# Patient Record
Sex: Male | Born: 1960 | Race: Black or African American | Hispanic: No | Marital: Single | Smoking: Current every day smoker
Health system: Southern US, Community
[De-identification: ages and names within clinical notes are randomized; demographics above are authoritative.]

## PROBLEM LIST (undated history)

## (undated) DIAGNOSIS — I2510 Atherosclerotic heart disease of native coronary artery without angina pectoris: Secondary | ICD-10-CM

## (undated) DIAGNOSIS — G629 Polyneuropathy, unspecified: Secondary | ICD-10-CM

## (undated) DIAGNOSIS — I10 Essential (primary) hypertension: Secondary | ICD-10-CM

## (undated) DIAGNOSIS — E785 Hyperlipidemia, unspecified: Secondary | ICD-10-CM

## (undated) DIAGNOSIS — M1990 Unspecified osteoarthritis, unspecified site: Secondary | ICD-10-CM

## (undated) DIAGNOSIS — G8929 Other chronic pain: Secondary | ICD-10-CM

## (undated) DIAGNOSIS — F32A Depression, unspecified: Secondary | ICD-10-CM

## (undated) DIAGNOSIS — J449 Chronic obstructive pulmonary disease, unspecified: Secondary | ICD-10-CM

## (undated) DIAGNOSIS — F329 Major depressive disorder, single episode, unspecified: Secondary | ICD-10-CM

## (undated) DIAGNOSIS — R569 Unspecified convulsions: Secondary | ICD-10-CM

## (undated) DIAGNOSIS — K219 Gastro-esophageal reflux disease without esophagitis: Secondary | ICD-10-CM

## (undated) DIAGNOSIS — F1910 Other psychoactive substance abuse, uncomplicated: Secondary | ICD-10-CM

## (undated) DIAGNOSIS — S3210XA Unspecified fracture of sacrum, initial encounter for closed fracture: Secondary | ICD-10-CM

## (undated) DIAGNOSIS — M549 Dorsalgia, unspecified: Secondary | ICD-10-CM

## (undated) DIAGNOSIS — K259 Gastric ulcer, unspecified as acute or chronic, without hemorrhage or perforation: Secondary | ICD-10-CM

## (undated) DIAGNOSIS — I219 Acute myocardial infarction, unspecified: Secondary | ICD-10-CM

## (undated) HISTORY — DX: Unspecified fracture of sacrum, initial encounter for closed fracture: S32.10XA

## (undated) HISTORY — DX: Major depressive disorder, single episode, unspecified: F32.9

## (undated) HISTORY — DX: Essential (primary) hypertension: I10

## (undated) HISTORY — PX: COLONOSCOPY W/ POLYPECTOMY: SHX1380

## (undated) HISTORY — DX: Hyperlipidemia, unspecified: E78.5

## (undated) HISTORY — PX: CORONARY ANGIOPLASTY WITH STENT PLACEMENT: SHX49

## (undated) HISTORY — DX: Polyneuropathy, unspecified: G62.9

## (undated) HISTORY — DX: Depression, unspecified: F32.A

## (undated) HISTORY — PX: FRACTURE SURGERY: SHX138

## (undated) HISTORY — PX: OTHER SURGICAL HISTORY: SHX169

## (undated) HISTORY — PX: HERNIA REPAIR: SHX51

## (undated) HISTORY — DX: Gastro-esophageal reflux disease without esophagitis: K21.9

---

## 1988-05-12 DIAGNOSIS — S3210XA Unspecified fracture of sacrum, initial encounter for closed fracture: Secondary | ICD-10-CM

## 1988-05-12 HISTORY — DX: Unspecified fracture of sacrum, initial encounter for closed fracture: S32.10XA

## 2009-10-21 ENCOUNTER — Emergency Department (HOSPITAL_COMMUNITY): Admission: EM | Admit: 2009-10-21 | Discharge: 2009-10-21 | Payer: Self-pay | Admitting: Emergency Medicine

## 2010-07-29 LAB — DIFFERENTIAL
Lymphocytes Relative: 39 % (ref 12–46)
Monocytes Absolute: 0.3 10*3/uL (ref 0.1–1.0)
Monocytes Relative: 7 % (ref 3–12)

## 2010-07-29 LAB — CBC
Hemoglobin: 13.6 g/dL (ref 13.0–17.0)
MCV: 86.2 fL (ref 78.0–100.0)
RBC: 4.56 MIL/uL (ref 4.22–5.81)
WBC: 5.1 10*3/uL (ref 4.0–10.5)

## 2010-10-12 ENCOUNTER — Emergency Department (HOSPITAL_COMMUNITY)
Admission: EM | Admit: 2010-10-12 | Discharge: 2010-10-12 | Disposition: A | Discharge status: Home / Self Care | Payer: Medicare Other | Attending: Emergency Medicine | Admitting: Emergency Medicine

## 2010-10-12 ENCOUNTER — Emergency Department (HOSPITAL_COMMUNITY): Payer: Medicare Other

## 2010-10-12 DIAGNOSIS — R51 Headache: Secondary | ICD-10-CM | POA: Insufficient documentation

## 2010-10-12 DIAGNOSIS — R0989 Other specified symptoms and signs involving the circulatory and respiratory systems: Secondary | ICD-10-CM | POA: Insufficient documentation

## 2010-10-12 DIAGNOSIS — R0609 Other forms of dyspnea: Secondary | ICD-10-CM | POA: Insufficient documentation

## 2010-10-12 DIAGNOSIS — R0602 Shortness of breath: Secondary | ICD-10-CM | POA: Insufficient documentation

## 2010-10-12 DIAGNOSIS — I252 Old myocardial infarction: Secondary | ICD-10-CM | POA: Insufficient documentation

## 2010-10-12 DIAGNOSIS — F329 Major depressive disorder, single episode, unspecified: Secondary | ICD-10-CM | POA: Insufficient documentation

## 2010-10-12 DIAGNOSIS — M129 Arthropathy, unspecified: Secondary | ICD-10-CM | POA: Insufficient documentation

## 2010-10-12 DIAGNOSIS — I10 Essential (primary) hypertension: Secondary | ICD-10-CM | POA: Insufficient documentation

## 2010-10-12 DIAGNOSIS — M62838 Other muscle spasm: Secondary | ICD-10-CM | POA: Insufficient documentation

## 2010-10-12 DIAGNOSIS — R42 Dizziness and giddiness: Secondary | ICD-10-CM | POA: Insufficient documentation

## 2010-10-12 DIAGNOSIS — F3289 Other specified depressive episodes: Secondary | ICD-10-CM | POA: Insufficient documentation

## 2010-10-12 LAB — URINALYSIS, ROUTINE W REFLEX MICROSCOPIC
Bilirubin Urine: NEGATIVE
Hgb urine dipstick: NEGATIVE
Nitrite: NEGATIVE
Specific Gravity, Urine: 1.009 (ref 1.005–1.030)
Urobilinogen, UA: 0.2 mg/dL (ref 0.0–1.0)

## 2010-10-12 LAB — POCT I-STAT, CHEM 8
Chloride: 103 mEq/L (ref 96–112)
Glucose, Bld: 86 mg/dL (ref 70–99)
HCT: 45 % (ref 39.0–52.0)
Hemoglobin: 15.3 g/dL (ref 13.0–17.0)
TCO2: 26 mmol/L (ref 0–100)

## 2010-10-12 LAB — TROPONIN I: Troponin I: <0.3 ng/mL (ref <0.30)

## 2010-10-14 LAB — URINE CULTURE: Culture  Setup Time: 201206022018

## 2010-12-25 ENCOUNTER — Encounter: Payer: Self-pay | Admitting: Internal Medicine

## 2010-12-25 ENCOUNTER — Ambulatory Visit (INDEPENDENT_AMBULATORY_CARE_PROVIDER_SITE_OTHER): Payer: Medicare Other | Admitting: Internal Medicine

## 2010-12-25 DIAGNOSIS — I10 Essential (primary) hypertension: Secondary | ICD-10-CM

## 2010-12-25 DIAGNOSIS — K219 Gastro-esophageal reflux disease without esophagitis: Secondary | ICD-10-CM

## 2010-12-25 DIAGNOSIS — R51 Headache: Secondary | ICD-10-CM

## 2010-12-25 DIAGNOSIS — F329 Major depressive disorder, single episode, unspecified: Secondary | ICD-10-CM

## 2010-12-25 DIAGNOSIS — M549 Dorsalgia, unspecified: Secondary | ICD-10-CM | POA: Insufficient documentation

## 2010-12-25 MED ORDER — HYDROCODONE-ACETAMINOPHEN 5-500 MG PO TABS: 1.0000 | ORAL_TABLET | Freq: Every day | ORAL | Status: DC

## 2010-12-25 MED ORDER — OMEPRAZOLE 20 MG PO CPDR: 20.0000 mg | DELAYED_RELEASE_CAPSULE | Freq: Every day | ORAL | Status: DC

## 2010-12-25 MED ORDER — ASPIRIN 81 MG PO TABS: 81.0000 mg | ORAL_TABLET | Freq: Every day | ORAL | Status: DC

## 2010-12-25 MED ORDER — FLUOXETINE HCL 10 MG PO CAPS: 10.0000 mg | ORAL_CAPSULE | Freq: Every day | ORAL | Status: DC

## 2010-12-25 MED ORDER — TERAZOSIN HCL 1 MG PO CAPS: 1.0000 mg | ORAL_CAPSULE | Freq: Every day | ORAL | Status: DC

## 2010-12-25 MED ORDER — LISINOPRIL 5 MG PO TABS: 5.0000 mg | ORAL_TABLET | Freq: Every day | ORAL | Status: DC

## 2010-12-25 NOTE — Patient Instructions (Signed)
Please schedule a follow up appointment in 1 week.
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
## 2010-12-26 DIAGNOSIS — F329 Major depressive disorder, single episode, unspecified: Secondary | ICD-10-CM | POA: Insufficient documentation

## 2010-12-26 NOTE — Assessment & Plan Note (Signed)
His medication list from [facility] was retrieved which shows that the patient is on Prozac 10. Denies any suicidal thoughts or ideations. Will continue current regimen for now.

## 2010-12-26 NOTE — Assessment & Plan Note (Addendum)
He complains of chronic back pain radiating to both legs. Etiology for his back pain is unclear at this point differentials include degenerative disc disease versus spinal stenosis. Will try to obtain records from [facility]. Will  give him 10 pills of Vicodin for symptom relief. I would review the records thoroughly before we start prescribing him narcotics or making him sign even pain contract. Patient has several red flags- sharing Percocets from his father, DWI ,history of cocaine abuse in the past!

## 2010-12-26 NOTE — Assessment & Plan Note (Signed)
Blood pressure is stable. [facility] medication [facility] from [facility] was retrieved. Patient is on 5 mg of lisinopril. We'll continue him on that

## 2010-12-27 DIAGNOSIS — R519 Headache, unspecified: Secondary | ICD-10-CM | POA: Insufficient documentation

## 2010-12-27 DIAGNOSIS — K219 Gastro-esophageal reflux disease without esophagitis: Secondary | ICD-10-CM | POA: Insufficient documentation

## 2010-12-27 DIAGNOSIS — R51 Headache: Secondary | ICD-10-CM | POA: Insufficient documentation

## 2010-12-27 NOTE — Assessment & Plan Note (Signed)
He reports headache located in the occipital region that has worsened and on for past few months. Differentials include stress versus hypertension. We'll continue to monitor for now. Patient was advised to followup in one week so that we are able to obtain his records and have more information about his chronic conditions.

## 2011-01-02 ENCOUNTER — Encounter: Payer: Medicare Other | Admitting: Internal Medicine

## 2011-01-06 ENCOUNTER — Emergency Department (HOSPITAL_COMMUNITY)
Admission: EM | Admit: 2011-01-06 | Discharge: 2011-01-06 | Disposition: A | Discharge status: Home / Self Care | Payer: Medicare Other | Attending: Emergency Medicine | Admitting: Emergency Medicine

## 2011-01-06 ENCOUNTER — Encounter: Payer: Medicare Other | Admitting: Internal Medicine

## 2011-01-06 DIAGNOSIS — M129 Arthropathy, unspecified: Secondary | ICD-10-CM | POA: Insufficient documentation

## 2011-01-06 DIAGNOSIS — H11429 Conjunctival edema, unspecified eye: Secondary | ICD-10-CM | POA: Insufficient documentation

## 2011-01-06 DIAGNOSIS — H538 Other visual disturbances: Secondary | ICD-10-CM | POA: Insufficient documentation

## 2011-01-06 DIAGNOSIS — T1590XA Foreign body on external eye, part unspecified, unspecified eye, initial encounter: Secondary | ICD-10-CM | POA: Insufficient documentation

## 2011-01-06 DIAGNOSIS — Z79899 Other long term (current) drug therapy: Secondary | ICD-10-CM | POA: Insufficient documentation

## 2011-01-06 DIAGNOSIS — S0510XA Contusion of eyeball and orbital tissues, unspecified eye, initial encounter: Secondary | ICD-10-CM | POA: Insufficient documentation

## 2011-01-06 DIAGNOSIS — I10 Essential (primary) hypertension: Secondary | ICD-10-CM | POA: Insufficient documentation

## 2011-01-06 DIAGNOSIS — I252 Old myocardial infarction: Secondary | ICD-10-CM | POA: Insufficient documentation

## 2011-01-06 DIAGNOSIS — F329 Major depressive disorder, single episode, unspecified: Secondary | ICD-10-CM | POA: Insufficient documentation

## 2011-01-06 DIAGNOSIS — H11419 Vascular abnormalities of conjunctiva, unspecified eye: Secondary | ICD-10-CM | POA: Insufficient documentation

## 2011-01-06 DIAGNOSIS — F3289 Other specified depressive episodes: Secondary | ICD-10-CM | POA: Insufficient documentation

## 2011-01-06 DIAGNOSIS — H5710 Ocular pain, unspecified eye: Secondary | ICD-10-CM | POA: Insufficient documentation

## 2011-01-06 DIAGNOSIS — H5789 Other specified disorders of eye and adnexa: Secondary | ICD-10-CM | POA: Insufficient documentation

## 2011-01-14 ENCOUNTER — Encounter: Payer: Self-pay | Admitting: Internal Medicine

## 2011-01-14 ENCOUNTER — Ambulatory Visit (INDEPENDENT_AMBULATORY_CARE_PROVIDER_SITE_OTHER): Payer: Medicare Other | Admitting: Internal Medicine

## 2011-01-14 DIAGNOSIS — I10 Essential (primary) hypertension: Secondary | ICD-10-CM

## 2011-01-14 DIAGNOSIS — I739 Peripheral vascular disease, unspecified: Secondary | ICD-10-CM

## 2011-01-14 DIAGNOSIS — F329 Major depressive disorder, single episode, unspecified: Secondary | ICD-10-CM

## 2011-01-14 DIAGNOSIS — M549 Dorsalgia, unspecified: Secondary | ICD-10-CM

## 2011-01-14 DIAGNOSIS — R51 Headache: Secondary | ICD-10-CM

## 2011-01-14 MED ORDER — FLUOXETINE HCL 10 MG PO CAPS: 10.0000 mg | ORAL_CAPSULE | Freq: Every day | ORAL | Status: DC

## 2011-01-14 MED ORDER — CYCLOBENZAPRINE HCL 10 MG PO TABS: 10.0000 mg | ORAL_TABLET | Freq: Three times a day (TID) | ORAL | Status: DC | PRN
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
## 2011-01-14 NOTE — Patient Instructions (Signed)
Back Pain - Chronic Back pain seldom lasts longer than 3 months. Long standing back pain can be caused by a ruptured disc. About half the time the exact cause cannot be found. Arthritis, osteoporosis, tumors, infections, previous back surgery, and other causes may be involved. Anxiety and depression are common factors. A ruptured disc often causes sciatica. This is a pain traveling from the low back down the back of the leg. This is due to irritation of the sciatic nerve. Weakness or numbness in the legs or feet and loss of normal bladder control are more serious signs of disc rupture. You should contact your doctor immediately if these symptoms develop. Avoid bending, heavy lifting, prolonged sitting, and activities which make the problem worse. Continue normal activity as much as possible. Take brief periods of rest throughout the day to reduce your pain during bad periods. A back exercise rehabilitation program can be helpful in reducing symptoms and preventing more pain. Muscle relaxants are sometimes used. Using narcotic pain medicine for long term pain is discouraged. Addiction is a possible outcome.  Surgery is considered only when symptoms do not improve with bed rest and other conservative treatment. You should see your caregiver if problems get worse. SEEK IMMEDIATE MEDICAL CARE IF:  You have marked weakness or numbness in one of your legs.   You have trouble controlling your bladder or bowels.   You develop nausea, vomiting, abdominal pain, shortness of breath or fainting.   You have severe pain not relieved with medications.  Document Released: 06/05/2004 Document Re-Released: 07/25/2008 Exit[employer] Patient Information 2011 [place], [place].  1. Start Flexeril 10 mg three times daily for your back pain.  You can continue to use heat pads as needed for the pain as well.  2. We will make arrangements for the check of your blood vessels in your legs.  3. Follow up in 2 weeks to see how your  back is doing.

## 2011-01-15 NOTE — Assessment & Plan Note (Signed)
BP Readings from Last 3 Encounters:  01/14/11 132/79  12/25/10 131/88   Blood pressure is similar to his last reading.  He has been taking the lisinopril 5 mg and states that he feels better.  We will continue to monitor.  With his history of MI his goal for his blood pressure is >130/80.

## 2011-01-15 NOTE — Assessment & Plan Note (Signed)
His symptoms of "tightness" behind his knees that worsens with activity is concerning given his current smoking as well as his history of MI even in the setting of cocaine abuse.  I am concerned for possible PVD and claudication that could be causing his leg pain.  We will get ABIs and follow up to see if there is anything that may be reversible or an explanation for his leg pain that does not seem to come from spinal cord compression.

## 2011-01-15 NOTE — Assessment & Plan Note (Signed)
I reviewed his MRI report from [facility] and he has mild/moderate L4-L5 disk disease but there is no definite compression of the nerves on MRI.  His symptoms do not fit neuropathic pain from his disk and his previous need for 4-8 percocets seems way out of proportion to what is seem on the MRI.  He has several warning signs for opiate abuse and would be a high risk candidate for opiate therapy.  His current symptoms appear to be from musculoskeletal back pain and would be better served with physical therapy, ice, heat packs, and occasional OTC pain medication.  With his history of GERD NSAIDs are likely not the best choice but could be tried on a very short term basis with a PPI for gastric protection.    OPIOID RISK TOOL - MALE  Family history of : # points   Alcohol abuse 3 Yes  Illegal drug use 3 Yes  Prescription drug abuse 4 No      Personal History of :    Alcohol abuse 3 Yes  Illegal drug use 4 Yes  Prescription drug abuse 5 No      Age 50 - 43 yrs 1 No      Psychological disease    ADHD / OCD / Bipolar / Schophrenia 2 No  Depression 1 Yes   Total Score 14  Low risk  0-3 Moderate risk  4-7 High risk  >7

## 2011-01-15 NOTE — Assessment & Plan Note (Signed)
His headaches are much improved with control of his blood pressure.  He states that he has some occasional headaches but not nearly as often and severe since his restarted his blood pressure medication.

## 2011-01-15 NOTE — Assessment & Plan Note (Signed)
He states that his mood has been good since he has moved back to [place] but states that he wants to restart his prozac anyway.  We will prescribe it for now and try to get him off in 6-12 months to see how he does.

## 2011-01-28 ENCOUNTER — Ambulatory Visit (INDEPENDENT_AMBULATORY_CARE_PROVIDER_SITE_OTHER): Payer: Medicare Other | Admitting: Internal Medicine

## 2011-01-28 ENCOUNTER — Encounter: Payer: Self-pay | Admitting: Internal Medicine

## 2011-01-28 VITALS — BP 149/91 | HR 88 | Temp 96.5°F | Ht 66.0 in | Wt 190.3 lb

## 2011-01-28 DIAGNOSIS — K219 Gastro-esophageal reflux disease without esophagitis: Secondary | ICD-10-CM

## 2011-01-28 DIAGNOSIS — I10 Essential (primary) hypertension: Secondary | ICD-10-CM

## 2011-01-28 DIAGNOSIS — M549 Dorsalgia, unspecified: Secondary | ICD-10-CM

## 2011-01-28 DIAGNOSIS — Z0000 Encounter for general adult medical examination without abnormal findings: Secondary | ICD-10-CM

## 2011-01-28 MED ORDER — LISINOPRIL 5 MG PO TABS: 10.0000 mg | ORAL_TABLET | Freq: Every day | ORAL | Status: DC

## 2011-01-28 MED ORDER — MELOXICAM 7.5 MG PO TABS: 7.5000 mg | ORAL_TABLET | Freq: Every day | ORAL | Status: AC

## 2011-01-28 MED ORDER — GABAPENTIN 800 MG PO TABS: 800.0000 mg | ORAL_TABLET | Freq: Three times a day (TID) | ORAL | Status: DC

## 2011-01-28 NOTE — Patient Instructions (Addendum)
  Please follow up in the pain clinic regarding additional therapies for you back pain. We have also referred you for a screening colonoscopy.  They will call you with the appointment.    Return to clinic to see your PCP in 1 month.  Please bring all your medications to your next clinic appointment.

## 2011-01-28 NOTE — Assessment & Plan Note (Signed)
Blood pressure is above goal of 130/70. While this could be elevated secondary to pain, he states his blood pressure always runs as high. Given this, I will increase his antihypertensive regimen today.  -- Increase lisinopril to 10 mg daily.

## 2011-01-28 NOTE — Assessment & Plan Note (Signed)
Flu shot refused. Referral for colonoscopy placed.

## 2011-01-28 NOTE — Assessment & Plan Note (Addendum)
I agree with Dr. Donnelly Stager assessment that long-term opiate therapy is very risky in this patient. I offered him restarting gabapentin, as he states this improved his pain and he described the radiations as having an electric component.  I offered him meloxicam, as it has the least GI toxicity of all the NSAIDs. We also discussed tramadol therapy. He states that he will not take either of these medications. He states the he doesn't know how how meloxicam might affect his system and doesn't want to try anything different. First he denied ever taking tramadol, but then refused it saying it made him "twitchy" when I suggested that as part of the plan. I also referred him for physical therapy and pain medicine consult. Mr. Benzel said he would likely would not attend pain medicine consult. He said that he would not trust anyone to perform injection into his back. I encouraged him to attend their clinic, as they might be able to offer him adjuvant therapies that I am unfamiliar with.   As Mr. Spradley is very resistant to non-opiate therapy for his back pain, or even seeing a pain medicine specialist for further pain management, I do not think the potential risk of prescribing opiates are warranted at this time. Further, reported MRI results does not inspire confidence that opiates are indicated.  Mr. Liford became very aggravated when he was told he would not be receiving Percocet at today's visit and indicated that he would continue to receive Percocet from his father.   -- Restart gabapentin 800 mg with taper up to 3 times a day. -- Sent in a prescription for meloxicam 7.5 mg daily. I told the patient that he could fill this prescription or not but that I wanted him to have something available for further pain issues. -- I elected not to issue a prescription for tramadol given lack of patient buy-in. -- pain medicine referral -- PT referral

## 2011-01-28 NOTE — Progress Notes (Signed)
Subjective:   Patient ID: Adam Cameron male   DOB: 04/20/1961 50 y.o.   MRN: [id number]  HPI: Adam Cameron is a 50 y.o. man with past medical history significant for back pain, hypertension, GERD and depression. He presents for followup regarding his back pain.  2 weeks ago Adam Cameron was seen by Dr. Tonny Branch regarding his back pain. Dr. Tonny Branch recommended ice packs alternated with heat packs, PT, controlling GERD with PPI, and then gently initiating nonsteroidal anti-inflammatory therapy. Dr. Tonny Branch also reviewed MRI report for spinal MRI done at [facility] which showed mild to moderate L4-L5 disc disease. Adam Cameron states that his pain has been severe over the past 2 weeks since his Percocet and gabapentin have been discontinued. He is requesting Percocet 10-650 4 times a day and Neurontin 800 3 times a day.  He states that he is taking his father's Percocet. He is willing to undergo pain contract in our clinic if we are willing to give him his "perks."    Briefly, Adam Cameron states that his back pain began when he fell off a roof and injured his tailbone. The pain is located in his low back extending bilaterally at the level of the iliac crests. He states the pain is constant and throbbing with electric sensations down his posterior thighs. He denies any loss of bowel or bladder control. Denies saddle anesthesia. He has not been taking any over-the-counter pain control. He states Tylenol does not work. He states that he not take nonsteroidals given his stomach problems. At baseline, his pain is 4-10 peaking at 9/10 occasionally.   Review of systems: Denies fevers, chills, headache, dizziness, reflux, chest pain, shortness of breath, abdominal pain, melena or bright red blood per rectum.  Past Medical History  Diagnosis Date  . GERD (gastroesophageal reflux disease)   . Hypertension   . Depression   . Neuropathy    Current Outpatient Prescriptions  Medication Sig Dispense  Refill  . aspirin 81 MG tablet Take 1 tablet (81 mg total) by mouth daily.  30 tablet  3  . cyclobenzaprine (FLEXERIL) 10 MG tablet Take 1 tablet (10 mg total) by mouth 3 (three) times daily as needed.  30 tablet  3  . FLUoxetine (PROZAC) 10 MG capsule Take 1 capsule (10 mg total) by mouth daily.  30 capsule  0  . lisinopril (PRINIVIL,ZESTRIL) 5 MG tablet Take 1 tablet (5 mg total) by mouth daily.  30 tablet  3  . omeprazole (PRILOSEC) 20 MG capsule Take 1 capsule (20 mg total) by mouth daily.  30 capsule  1   Family History  Problem Relation Age of Onset  . Diabetes Mother   . Hypertension Mother   . Hypertension Father   . Stroke Father   . Heart disease Father    History   Social History  . Marital Status: Single    Spouse Name: N/A    Number of Children: N/A  . Years of Education: N/A   Social History Main Topics  . Smoking status: Current Everyday Smoker -- 0.5 packs/day    Types: Cigarettes  . Smokeless tobacco: Not on file   Comment: has started to on his own.  Recently restarted. 01/14/2011 trying to stop again  . Alcohol Use: Yes     occasional wine or beer  . Drug Use: No     History of cocaine abuse in 2006.  Marland Kitchen Sexually Active: Not on file   Other Topics Concern  . Not on file  Social History Narrative   Single. Live by himself in [place].    Objective:  Physical Exam: There were no vitals filed for this visit. Constitutional: Vital signs reviewed.  Patient is a well-developed and well-nourished man in no acute distress and cooperative with exam. Alert and oriented x3.  Head: Normocephalic and atraumatic Mouth: no erythema or exudates, MMM. Patient had a teeth Eyes: PERRL, EOMI, conjunctivae normal, No scleral icterus.  Neck: Supple, Trachea midline normal ROM, Cardiovascular: RRR, S1 normal, S2 normal, no MRG, pulses symmetric and intact bilaterally Pulmonary/Chest: CTAB, no wheezes, rales, or rhonchi Abdominal: Soft. Non-tender, non-distended, bowel  sounds are normal, no masses, organomegaly, or guarding present.  Musculoskeletal: No joint deformities, erythema. The patient is distracted he is able to perform sit up without flinching. Patient has pain upon palpation just superior to the sacrum on the spine as well as the bilateral erector spinae muscles lateral to this level. Straight leg test is negative for spinal stenosis.  Neurological: A&O x3, Strenght is normal and symmetric bilaterally, cranial nerve II-XII are grossly intact, no focal motor deficit, sensory intact to light touch bilaterally.  Skin: Warm, dry and intact. No rash, cyanosis, or clubbing.

## 2011-01-29 NOTE — Progress Notes (Signed)
Dr Samuella Bruin discussed Mr [facility] care with me and I agree with his note and his assessment that the pt is at high risk for opioid misuse. It is also concerning that Mr Standley refused any nonnarcotic treatment options that Dr Candy Sledge offered.

## 2011-02-18 ENCOUNTER — Encounter: Payer: Medicare Other | Attending: Physical Medicine & Rehabilitation

## 2011-02-18 ENCOUNTER — Ambulatory Visit: Payer: Medicare Other | Admitting: Physical Medicine & Rehabilitation

## 2011-02-23 ENCOUNTER — Encounter: Payer: Self-pay | Admitting: Internal Medicine

## 2011-02-23 DIAGNOSIS — Z0000 Encounter for general adult medical examination without abnormal findings: Secondary | ICD-10-CM | POA: Insufficient documentation

## 2011-02-24 ENCOUNTER — Ambulatory Visit (INDEPENDENT_AMBULATORY_CARE_PROVIDER_SITE_OTHER): Payer: Medicare Other | Admitting: Internal Medicine

## 2011-02-24 VITALS — BP 131/84 | HR 81 | Temp 97.1°F | Ht 66.0 in | Wt 194.0 lb

## 2011-02-24 DIAGNOSIS — M549 Dorsalgia, unspecified: Secondary | ICD-10-CM

## 2011-02-24 NOTE — Patient Instructions (Signed)
Please take your medicines as prescribed. Please schedule a follow up appointment in 2 months. Please bring your medication bottles with your next visit.  Back Pain (Lumbosacral Strain)  Back pain is one of the most common causes of pain. There are many causes of back pain. Most are not serious conditions.  CAUSES Your backbone (spinal column) is made up of 24 main vertebral bodies, the sacrum, and the coccyx. These are held together by muscles and tough, fibrous tissue (ligaments). Nerve roots pass through the openings between the vertebrae. A sudden move or injury to the back may cause injury to, or pressure on, these nerves. This may result in localized back pain or pain movement (radiation) into the buttocks, down the leg, and into the foot. Sharp, shooting pain from the buttock down the back of the leg (sciatica) is frequently associated with a ruptured (herniated) disc. Pain may be caused by muscle spasm alone. Your caregiver can often find the cause of your pain by the details of your symptoms and an exam. In some cases, you may need tests (such as X-rays). Your caregiver will work with you to decide if any tests are needed based on your specific exam. HOME CARE INSTRUCTIONS  Avoid an underactive lifestyle. Active exercise, as directed by your caregiver, is your greatest weapon against back pain.   Avoid hard physical activities (tennis, racquetball, water-skiing) if you are not in proper physical condition for it. This may aggravate and/or create problems.   If you have a back problem, avoid sports requiring sudden body movements. Swimming and walking are generally safer activities.   Maintain good posture.   Avoid becoming overweight (obese).   Use bed rest for only the most extreme, sudden (acute) episode. Your caregiver will help you determine how much bed rest is necessary.   For acute conditions, you may put ice on the injured area.   Put ice in a plastic bag.   Place a  towel between your skin and the bag.   Leave the ice on for 5 minutes at a time, every 2 hours, or as needed.   After you are improved and more active, it may help to apply heat for 30 minutes before activities.  See your caregiver if you are having pain that lasts longer than expected. Your caregiver can advise appropriate exercises and/or therapy if needed. With conditioning, most back problems can be avoided. SEEK IMMEDIATE MEDICAL CARE IF:  You have numbness, tingling, weakness, or problems with the use of your arms or legs.   You experience severe back pain not relieved with medicines.   There is a change in bowel or bladder control.   You have increasing pain in any area of the body, including your belly (abdomen).   You notice shortness of breath, dizziness, or feel faint.   You feel sick to your stomach (nauseous), are throwing up (vomiting), or become sweaty.   You notice discoloration of your toes or legs, or your feet get very cold. MAKE SURE YOU:   Understand these instructions.   Will watch your condition.   Will get help right away if you are not doing well or get worse.  Document Released: 02/05/2005 Document Re-Released: 07/23/2009 [facility] Patient Information 2011 [place], [place].

## 2011-02-24 NOTE — Assessment & Plan Note (Addendum)
He continues to complain of chronic low back pain and was requesting Percocets at this visit. Just to re- emphasize, his MRI was reviewed that showed L4-5 disc disease but not significant enough to start him on pain medications. Apparently he went to the pain medicine clinic where he was advised to get steroid shots that he refused. He was again offered  tramadol that he refused.  -Would not prescribe him opiates. He was very frustrated on the fact that we could not give him any opiates and therefore did not want to discuss any other medical problems with today's visit.

## 2011-02-24 NOTE — Progress Notes (Signed)
  Subjective:    Patient ID: Adam Cameron, male    DOB: [birth year], 50 y.o.   MRN: [id number]  HPI: 50 year old man with past medical history significant for hypertension, chronic low back pain, depression comes to the clinic for a followup visit.  He had no concerns at today's visit except for his low back pain. He  is very frustrated with our care and on the fact that we are not prescribing him opiate pain medications.  Apparently he went to the pain medicine clinic and they prescribed him some shots in his back which he didn't want to take. He never followed up with PT/OT.  He states that sharing his father's Percocets but for the last 4 days, he has not been taking any pain medications because his father is in the hospital.  He currently rates his pain as 10 out of 10 , describes pain and burning and throbbing, located in the lower back with radiation into her buttocks and thighs.    Review of Systems  Constitutional: Negative for fever, diaphoresis, activity change and appetite change.  HENT: Negative for ear pain, nosebleeds, facial swelling, sneezing, neck pain and postnasal drip.   Eyes: Negative for visual disturbance.  Respiratory: Negative for apnea, cough, choking, chest tightness and shortness of breath.   Cardiovascular: Negative for chest pain, palpitations and leg swelling.  Gastrointestinal: Negative for abdominal pain, abdominal distention and anal bleeding.  Genitourinary: Negative for dysuria, hematuria, flank pain, enuresis and difficulty urinating.  Musculoskeletal: Positive for back pain. Negative for arthralgias.  Neurological: Negative for dizziness, seizures, light-headedness, numbness and headaches.  Hematological: Negative for adenopathy.  Psychiatric/Behavioral: Negative for agitation.       Objective:   Physical Exam  Constitutional: He is oriented to person, place, and time. He appears well-developed and well-nourished. No distress.  HENT:  Head:  Normocephalic and atraumatic.  Mouth/Throat: No oropharyngeal exudate.  Eyes: Conjunctivae and EOM are normal. Pupils are equal, round, and reactive to light. Right eye exhibits no discharge. Left eye exhibits no discharge. No scleral icterus.  Neck: Normal range of motion. Neck supple. No JVD present. No tracheal deviation present. No thyromegaly present.  Cardiovascular: Normal rate, regular rhythm, normal heart sounds and intact distal pulses.  Exam reveals no gallop and no friction rub.   No murmur heard. Pulmonary/Chest: Effort normal and breath sounds normal. No stridor. No respiratory distress. He has no wheezes. He has no rales. He exhibits no tenderness.  Abdominal: Soft. Bowel sounds are normal. He exhibits no distension and no mass. There is no tenderness. There is no rebound and no guarding.  Musculoskeletal: Normal range of motion. He exhibits no edema and no tenderness.       Negative bilateral SLRT.  Lymphadenopathy:    He has no cervical adenopathy.  Neurological: He is alert and oriented to person, place, and time. He has normal reflexes. He displays normal reflexes. No cranial nerve deficit. He exhibits normal muscle tone. Coordination normal.  Skin: Skin is warm. He is not diaphoretic.          Assessment & Plan:

## 2011-02-27 ENCOUNTER — Ambulatory Visit: Payer: Medicare Other | Admitting: Internal Medicine

## 2011-02-27 DIAGNOSIS — Z0289 Encounter for other administrative examinations: Secondary | ICD-10-CM

## 2011-03-11 ENCOUNTER — Ambulatory Visit: Payer: Medicare Other | Admitting: Physical Medicine & Rehabilitation

## 2011-03-20 NOTE — Progress Notes (Signed)
Addended by: Neomia Dear on: 03/20/2011 06:35 PM   Modules accepted: Orders

## 2012-05-25 ENCOUNTER — Encounter: Payer: Self-pay | Admitting: Gastroenterology

## 2012-06-16 ENCOUNTER — Telehealth: Payer: Self-pay

## 2012-06-21 ENCOUNTER — Encounter: Payer: Medicare Other | Admitting: Gastroenterology

## 2012-06-22 NOTE — Telephone Encounter (Signed)
Left message

## 2012-07-13 ENCOUNTER — Ambulatory Visit: Payer: Medicare Other | Admitting: Endocrinology

## 2012-07-16 ENCOUNTER — Ambulatory Visit: Payer: Medicare Other | Admitting: Endocrinology

## 2012-07-20 ENCOUNTER — Ambulatory Visit: Payer: Medicare Other | Admitting: Endocrinology

## 2012-07-26 ENCOUNTER — Ambulatory Visit (INDEPENDENT_AMBULATORY_CARE_PROVIDER_SITE_OTHER): Payer: Medicare Other | Admitting: Endocrinology

## 2012-07-26 VITALS — BP 142/78 | HR 83 | Wt 170.0 lb

## 2012-07-26 DIAGNOSIS — Z0000 Encounter for general adult medical examination without abnormal findings: Secondary | ICD-10-CM

## 2012-07-26 NOTE — Patient Instructions (Addendum)
please call [phone] ([facility] physician referral line), to get an appointment with a new primary doctor.

## 2012-07-26 NOTE — Progress Notes (Signed)
  Subjective:    Patient ID: Adam Cameron, male    DOB: 1961/01/21, 52 y.o.   MRN: [id number]  HPI No visit.  scheduling error   Review of Systems     Objective:   Physical Exam        Assessment & Plan:

## 2012-09-21 ENCOUNTER — Emergency Department (HOSPITAL_COMMUNITY)
Admission: EM | Admit: 2012-09-21 | Discharge: 2012-09-21 | Disposition: A | Discharge status: Home / Self Care | Payer: Medicare Other | Attending: Emergency Medicine | Admitting: Emergency Medicine

## 2012-09-21 ENCOUNTER — Emergency Department (HOSPITAL_COMMUNITY): Payer: Medicare Other

## 2012-09-21 ENCOUNTER — Encounter (HOSPITAL_COMMUNITY): Payer: Self-pay | Admitting: Emergency Medicine

## 2012-09-21 DIAGNOSIS — S0181XA Laceration without foreign body of other part of head, initial encounter: Secondary | ICD-10-CM

## 2012-09-21 DIAGNOSIS — IMO0002 Reserved for concepts with insufficient information to code with codable children: Secondary | ICD-10-CM | POA: Insufficient documentation

## 2012-09-21 DIAGNOSIS — Z8659 Personal history of other mental and behavioral disorders: Secondary | ICD-10-CM | POA: Insufficient documentation

## 2012-09-21 DIAGNOSIS — Z880 Allergy status to penicillin: Secondary | ICD-10-CM | POA: Insufficient documentation

## 2012-09-21 DIAGNOSIS — Z8719 Personal history of other diseases of the digestive system: Secondary | ICD-10-CM | POA: Insufficient documentation

## 2012-09-21 DIAGNOSIS — S62309A Unspecified fracture of unspecified metacarpal bone, initial encounter for closed fracture: Secondary | ICD-10-CM | POA: Insufficient documentation

## 2012-09-21 DIAGNOSIS — S0180XA Unspecified open wound of other part of head, initial encounter: Secondary | ICD-10-CM | POA: Insufficient documentation

## 2012-09-21 DIAGNOSIS — Z8669 Personal history of other diseases of the nervous system and sense organs: Secondary | ICD-10-CM | POA: Insufficient documentation

## 2012-09-21 DIAGNOSIS — I10 Essential (primary) hypertension: Secondary | ICD-10-CM | POA: Insufficient documentation

## 2012-09-21 DIAGNOSIS — S20319A Abrasion of unspecified front wall of thorax, initial encounter: Secondary | ICD-10-CM

## 2012-09-21 DIAGNOSIS — Z8781 Personal history of (healed) traumatic fracture: Secondary | ICD-10-CM | POA: Insufficient documentation

## 2012-09-21 DIAGNOSIS — S62304A Unspecified fracture of fourth metacarpal bone, right hand, initial encounter for closed fracture: Secondary | ICD-10-CM

## 2012-09-21 DIAGNOSIS — Z87891 Personal history of nicotine dependence: Secondary | ICD-10-CM | POA: Insufficient documentation

## 2012-09-21 MED ORDER — HYDROCODONE-ACETAMINOPHEN 5-325 MG PO TABS: 2.0000 | ORAL_TABLET | ORAL | Status: DC | PRN
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
## 2012-09-22 ENCOUNTER — Emergency Department (HOSPITAL_COMMUNITY)
Admission: EM | Admit: 2012-09-22 | Discharge: 2012-09-22 | Discharge status: Against Medical Advice | Payer: Medicare Other | Attending: Emergency Medicine | Admitting: Emergency Medicine

## 2012-09-22 ENCOUNTER — Telehealth (HOSPITAL_COMMUNITY): Payer: Self-pay | Admitting: Emergency Medicine

## 2012-09-22 ENCOUNTER — Encounter (HOSPITAL_COMMUNITY): Payer: Self-pay | Admitting: Emergency Medicine

## 2012-09-22 DIAGNOSIS — I10 Essential (primary) hypertension: Secondary | ICD-10-CM | POA: Insufficient documentation

## 2012-09-22 DIAGNOSIS — Z8719 Personal history of other diseases of the digestive system: Secondary | ICD-10-CM | POA: Insufficient documentation

## 2012-09-22 DIAGNOSIS — F3289 Other specified depressive episodes: Secondary | ICD-10-CM | POA: Insufficient documentation

## 2012-09-22 DIAGNOSIS — Z8669 Personal history of other diseases of the nervous system and sense organs: Secondary | ICD-10-CM | POA: Insufficient documentation

## 2012-09-22 DIAGNOSIS — F329 Major depressive disorder, single episode, unspecified: Secondary | ICD-10-CM | POA: Insufficient documentation

## 2012-09-22 DIAGNOSIS — Z87891 Personal history of nicotine dependence: Secondary | ICD-10-CM | POA: Insufficient documentation

## 2012-09-22 DIAGNOSIS — G8911 Acute pain due to trauma: Secondary | ICD-10-CM | POA: Insufficient documentation

## 2012-09-22 DIAGNOSIS — M79609 Pain in unspecified limb: Secondary | ICD-10-CM | POA: Insufficient documentation

## 2012-09-22 DIAGNOSIS — Z760 Encounter for issue of repeat prescription: Secondary | ICD-10-CM | POA: Insufficient documentation

## 2012-09-22 MED ORDER — TRAMADOL HCL 50 MG PO TABS: 50.0000 mg | ORAL_TABLET | Freq: Four times a day (QID) | ORAL | Status: DC | PRN
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
## 2012-09-22 NOTE — ED Provider Notes (Signed)
Medical screening examination/treatment/procedure(s) were performed by non-physician practitioner and as supervising physician I was immediately available for consultation/collaboration.   Carleene Cooper III, MD 09/22/12 Mikle Bosworth

## 2012-09-22 NOTE — ED Notes (Signed)
Per PA, patient was unhappy with medication that PA was going to prescribe and he decided to leave.

## 2012-12-13 ENCOUNTER — Emergency Department (HOSPITAL_COMMUNITY)
Admission: EM | Admit: 2012-12-13 | Discharge: 2012-12-13 | Disposition: A | Discharge status: Home / Self Care | Payer: No Typology Code available for payment source | Attending: Emergency Medicine | Admitting: Emergency Medicine

## 2012-12-13 ENCOUNTER — Encounter (HOSPITAL_COMMUNITY): Payer: Self-pay | Admitting: Emergency Medicine

## 2012-12-13 ENCOUNTER — Emergency Department (HOSPITAL_COMMUNITY): Payer: No Typology Code available for payment source

## 2012-12-13 DIAGNOSIS — F3289 Other specified depressive episodes: Secondary | ICD-10-CM | POA: Insufficient documentation

## 2012-12-13 DIAGNOSIS — Z8719 Personal history of other diseases of the digestive system: Secondary | ICD-10-CM | POA: Diagnosis not present

## 2012-12-13 DIAGNOSIS — F17200 Nicotine dependence, unspecified, uncomplicated: Secondary | ICD-10-CM | POA: Insufficient documentation

## 2012-12-13 DIAGNOSIS — Z8669 Personal history of other diseases of the nervous system and sense organs: Secondary | ICD-10-CM | POA: Diagnosis not present

## 2012-12-13 DIAGNOSIS — IMO0002 Reserved for concepts with insufficient information to code with codable children: Secondary | ICD-10-CM | POA: Diagnosis present

## 2012-12-13 DIAGNOSIS — M545 Low back pain: Secondary | ICD-10-CM

## 2012-12-13 DIAGNOSIS — F329 Major depressive disorder, single episode, unspecified: Secondary | ICD-10-CM | POA: Insufficient documentation

## 2012-12-13 DIAGNOSIS — Z7982 Long term (current) use of aspirin: Secondary | ICD-10-CM | POA: Insufficient documentation

## 2012-12-13 DIAGNOSIS — M129 Arthropathy, unspecified: Secondary | ICD-10-CM | POA: Diagnosis not present

## 2012-12-13 DIAGNOSIS — G8929 Other chronic pain: Secondary | ICD-10-CM | POA: Insufficient documentation

## 2012-12-13 DIAGNOSIS — S298XXA Other specified injuries of thorax, initial encounter: Secondary | ICD-10-CM | POA: Insufficient documentation

## 2012-12-13 DIAGNOSIS — Y92410 Unspecified street and highway as the place of occurrence of the external cause: Secondary | ICD-10-CM | POA: Insufficient documentation

## 2012-12-13 DIAGNOSIS — I10 Essential (primary) hypertension: Secondary | ICD-10-CM | POA: Diagnosis not present

## 2012-12-13 DIAGNOSIS — Y9389 Activity, other specified: Secondary | ICD-10-CM | POA: Insufficient documentation

## 2012-12-13 DIAGNOSIS — Z79899 Other long term (current) drug therapy: Secondary | ICD-10-CM | POA: Insufficient documentation

## 2012-12-13 DIAGNOSIS — R079 Chest pain, unspecified: Secondary | ICD-10-CM

## 2012-12-13 HISTORY — DX: Other chronic pain: G89.29

## 2012-12-13 HISTORY — DX: Dorsalgia, unspecified: M54.9

## 2012-12-13 HISTORY — DX: Unspecified osteoarthritis, unspecified site: M19.90

## 2012-12-13 MED ORDER — OXYCODONE HCL 5 MG PO TABS
5.0000 mg | ORAL_TABLET | Freq: Once | ORAL | Status: AC
  Administered 2012-12-13: 5 mg via ORAL
  Filled 2012-12-13: qty 1
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
## 2012-12-13 NOTE — ED Notes (Signed)
C-collar and spine board removed by Dr. Patria Mane.  Pt sitting up on stretcher using urinal.

## 2013-01-13 ENCOUNTER — Other Ambulatory Visit (HOSPITAL_COMMUNITY): Payer: Self-pay | Admitting: Chiropractic Medicine

## 2013-01-18 ENCOUNTER — Ambulatory Visit (HOSPITAL_COMMUNITY)
Admission: RE | Admit: 2013-01-18 | Discharge: 2013-01-18 | Disposition: A | Discharge status: Home / Self Care | Payer: No Typology Code available for payment source | Source: Ambulatory Visit | Attending: Chiropractic Medicine | Admitting: Chiropractic Medicine

## 2013-01-24 ENCOUNTER — Ambulatory Visit (HOSPITAL_COMMUNITY)
Admission: RE | Admit: 2013-01-24 | Discharge: 2013-01-24 | Disposition: A | Discharge status: Home / Self Care | Payer: No Typology Code available for payment source | Source: Ambulatory Visit | Attending: Chiropractic Medicine | Admitting: Chiropractic Medicine

## 2013-01-24 DIAGNOSIS — M67919 Unspecified disorder of synovium and tendon, unspecified shoulder: Secondary | ICD-10-CM | POA: Diagnosis not present

## 2013-01-24 DIAGNOSIS — S46819A Strain of other muscles, fascia and tendons at shoulder and upper arm level, unspecified arm, initial encounter: Secondary | ICD-10-CM | POA: Diagnosis not present

## 2013-01-24 DIAGNOSIS — M24119 Other articular cartilage disorders, unspecified shoulder: Secondary | ICD-10-CM | POA: Insufficient documentation

## 2013-01-24 DIAGNOSIS — M719 Bursopathy, unspecified: Secondary | ICD-10-CM | POA: Insufficient documentation

## 2013-01-24 DIAGNOSIS — R209 Unspecified disturbances of skin sensation: Secondary | ICD-10-CM | POA: Diagnosis not present

## 2013-01-24 DIAGNOSIS — M25519 Pain in unspecified shoulder: Secondary | ICD-10-CM | POA: Insufficient documentation

## 2013-02-07 ENCOUNTER — Other Ambulatory Visit: Payer: Self-pay | Admitting: Pain Medicine

## 2013-02-07 DIAGNOSIS — M545 Low back pain: Secondary | ICD-10-CM

## 2013-02-07 DIAGNOSIS — M542 Cervicalgia: Secondary | ICD-10-CM

## 2013-02-12 ENCOUNTER — Ambulatory Visit
Admission: RE | Admit: 2013-02-12 | Discharge: 2013-02-12 | Disposition: A | Discharge status: Home / Self Care | Payer: Medicare Other | Source: Ambulatory Visit | Attending: Pain Medicine | Admitting: Pain Medicine

## 2013-02-12 DIAGNOSIS — M542 Cervicalgia: Secondary | ICD-10-CM

## 2013-02-12 DIAGNOSIS — M545 Low back pain: Secondary | ICD-10-CM

## 2013-07-01 ENCOUNTER — Other Ambulatory Visit (HOSPITAL_COMMUNITY): Payer: Medicare Other | Attending: Psychiatry | Admitting: Psychology

## 2013-07-01 DIAGNOSIS — F1920 Other psychoactive substance dependence, uncomplicated: Secondary | ICD-10-CM | POA: Insufficient documentation

## 2013-07-01 DIAGNOSIS — F1020 Alcohol dependence, uncomplicated: Secondary | ICD-10-CM | POA: Diagnosis present

## 2013-07-01 DIAGNOSIS — Z833 Family history of diabetes mellitus: Secondary | ICD-10-CM | POA: Diagnosis not present

## 2013-07-01 DIAGNOSIS — F1120 Opioid dependence, uncomplicated: Secondary | ICD-10-CM | POA: Insufficient documentation

## 2013-07-01 DIAGNOSIS — F329 Major depressive disorder, single episode, unspecified: Secondary | ICD-10-CM | POA: Diagnosis not present

## 2013-07-01 DIAGNOSIS — F1420 Cocaine dependence, uncomplicated: Secondary | ICD-10-CM | POA: Insufficient documentation

## 2013-07-01 DIAGNOSIS — G589 Mononeuropathy, unspecified: Secondary | ICD-10-CM | POA: Insufficient documentation

## 2013-07-01 DIAGNOSIS — I10 Essential (primary) hypertension: Secondary | ICD-10-CM | POA: Insufficient documentation

## 2013-07-01 DIAGNOSIS — F3289 Other specified depressive episodes: Secondary | ICD-10-CM | POA: Diagnosis not present

## 2013-07-01 DIAGNOSIS — K219 Gastro-esophageal reflux disease without esophagitis: Secondary | ICD-10-CM | POA: Diagnosis not present

## 2013-07-01 DIAGNOSIS — F1994 Other psychoactive substance use, unspecified with psychoactive substance-induced mood disorder: Secondary | ICD-10-CM | POA: Insufficient documentation
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
## 2013-07-03 ENCOUNTER — Encounter (HOSPITAL_COMMUNITY): Payer: Self-pay | Admitting: Psychology

## 2013-07-03 NOTE — Progress Notes (Signed)
Daily Group Progress Note  Program: CD-IOP   Group Time: 1-2:30 pm  Participation Level: Active  Behavioral Response: Grandiose and Monopolizing  Type of Therapy: Process Group  Topic: Group process: the first part of group was spent in process. Members checked in on current issues, concerns, and obstacles in early recovery. One member appeared today, who had not been present on Wednesday. This young woman had checked in with a new sobriety date.   . A new group member provided some feedback and, as the session progressed, and he talked more, he seemed to know everything about recovery. His dominating conversation was clearly irritating other members and when I finally asked a member what she was feeling, she admitted she was aggravated. She explained that someone was doing most of the talking and she found it frustrating. Another member noted that if he knew so much about recovery, what was he doing in this group? The challenges brought the entire group to life and there was a lengthy discussion about the role of group and what is and is not expected from group members. The session proved therapeutic and revealing for all present. During this session, the medical director was meeting with two members scheduled to discharge next week along with two new group members.   Group Time: 2:45- 4pm  Participation Level: Active  Behavioral Response: Appropriate and Sharing  Type of Therapy: Psycho-education Group  Topic: The second half of group was spent in a psycho-ed about early recovery and PEPS; an acronym for participation, enthusiasm, practice, and support. A more thorough presentation on these 4 aspects of a recovery lifestyle were discussed at length. Following was a handout provided members listing different activities people might consider in early recovery. During this half of group, cupcakes were passed around and a candle on one presented to a member whose birthday was today. She  made a wish, blew out the candle, and the group sang Happy Birthday. Members shared the things they did and discussed the activities they would like to consider in the future. The group laughed as they discussed hobbies they hoped to return to once more or begin anew. As the session ended, members shared with each other their recovery plans for the weekend.    Summary: The patient was new to the group and checked-in with a sobriety date of 2/18. He reported he had been very involved in NA when he lived in [place], but had not attending meetings since moving here about 3 years ago. He offered suggestions to the recently relapsed young woman in the group. He talked while others said very little and he was the one confronted by the group for his dominating conversation. He insisted he wasn't offended by the group and then shared actual person information about himself and addiction. The group connected with these disclosures. I pointed out how sharing about one's self is what each group member should do and/or sharing about what they have done in certain circumstances when providing feedback to others. The patient suggested that he was overly excited to be back in treatment and brought too much enthusiasm.  The patient was apologetic, but appropriate for the remainder of the session. He met with the medical director as the group session was ending. Hopefully, the patient will be more conscious of his role in the group. He reported he would attend some of the NA meetings recommended by another group member. We will continue to follow closely.    Family Program: Family  present? No   Name of family member(s):   UDS collected: No Results:   AA/NA attended?: No  Sponsor?: No   Jasenia Weilbacher, LCAS

## 2013-07-04 ENCOUNTER — Other Ambulatory Visit (HOSPITAL_COMMUNITY): Payer: Medicare Other | Admitting: Psychology

## 2013-07-04 DIAGNOSIS — F1020 Alcohol dependence, uncomplicated: Secondary | ICD-10-CM | POA: Diagnosis not present

## 2013-07-04 DIAGNOSIS — F1920 Other psychoactive substance dependence, uncomplicated: Secondary | ICD-10-CM
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
## 2013-07-05 ENCOUNTER — Encounter (HOSPITAL_COMMUNITY): Payer: Self-pay | Admitting: Psychology

## 2013-07-05 ENCOUNTER — Telehealth (HOSPITAL_COMMUNITY): Payer: Self-pay | Admitting: Psychology
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
## 2013-07-06 ENCOUNTER — Other Ambulatory Visit (HOSPITAL_COMMUNITY): Payer: Medicare Other | Admitting: Psychology

## 2013-07-06 DIAGNOSIS — F1920 Other psychoactive substance dependence, uncomplicated: Secondary | ICD-10-CM

## 2013-07-06 DIAGNOSIS — F1020 Alcohol dependence, uncomplicated: Secondary | ICD-10-CM | POA: Diagnosis not present

## 2013-07-08 ENCOUNTER — Encounter (HOSPITAL_COMMUNITY): Payer: Self-pay | Admitting: Psychology

## 2013-07-08 ENCOUNTER — Other Ambulatory Visit (HOSPITAL_COMMUNITY): Payer: Medicare Other

## 2013-07-08 NOTE — Progress Notes (Signed)
    Daily Group Progress Note  Program: CD-IOP   Group Time: 1:00-2:30pm  Participation Level: Active  Behavioral Response: Appropriate  Type of Therapy: Process Group  Topic: The first part of group was spent in process.  Group members shared their names and sobriety dates, and then counselor led 5 minutes of relaxation with HeartMath.  The group then discussed issues related to early recovery, including finding 12-step meetings that meet their needs.  Several group members shared concerns about the results of their recent drug tests, and one group member admitted to a relapse this past Sunday.  The group helped her process the relapse and shared their own experiences with relapse.     Group Time: 2:45-4:00pm  Participation Level: Active  Behavioral Response: Appropriate  Type of Therapy: Psycho-education Group  Topic: Counselor asked the group to revisit the Core Beliefs handout from the previous group session, explained the concept of core beliefs, and where they come from.  Counselor also shared a handout on "Ten Ways to Untwist your Thinking" and reviewed the first several points.  The group discussed methods to challenge negative and unhealthy core beliefs, for example by using compassionate self-talk and examining evidence for and against the core belief.  The session ended with a graduation ceremony for a member who has completed the program.   Summary: Patient reported a sobriety date of 2/18, and later in the session amended it to 2/21 when he admitted to taking an opiate painkiller that belonged to his father on Friday, 2/20.  He reported that he had not attended any 12-step meetings yet.  He shared that he was stressed by issues with his fiance, who is pregnant and in active addiction, and his father, who requires nursing home care but cannot yet move into a nursing home due to debt.  He reported that these situations caused thoughts of using, and that he coped with these  thoughts by talking with friends and saying the [employer] Prayer.     Family Program: Family present? No   Name of family member(s): n/a  UDS collected: Yes Results: positive for cocaine  AA/NA attended?: No  Sponsor?: No   EVANS,ANN, LCAS

## 2013-07-10 ENCOUNTER — Encounter (HOSPITAL_COMMUNITY): Payer: Self-pay | Admitting: Psychology

## 2013-07-10 NOTE — Progress Notes (Unsigned)
Patient ID: Adam IslamKenneth Cameron, male   DOB: 11/19/1960, 53 y.o.   MRN: [id number] CD-IOP: The patient did not appear for group today nor did he phone to explain his absence. This will be his first unexcused absence. He entered the program last Friday, February 20th, and has attended 3 group sessions prior to this absence. We will wait to hear from him or see him next Monday.

## 2013-07-11 ENCOUNTER — Other Ambulatory Visit (HOSPITAL_COMMUNITY): Payer: Medicare Other | Attending: Psychiatry | Admitting: Psychology

## 2013-07-11 DIAGNOSIS — F3289 Other specified depressive episodes: Secondary | ICD-10-CM | POA: Insufficient documentation

## 2013-07-11 DIAGNOSIS — Z833 Family history of diabetes mellitus: Secondary | ICD-10-CM | POA: Insufficient documentation

## 2013-07-11 DIAGNOSIS — K219 Gastro-esophageal reflux disease without esophagitis: Secondary | ICD-10-CM | POA: Insufficient documentation

## 2013-07-11 DIAGNOSIS — F329 Major depressive disorder, single episode, unspecified: Secondary | ICD-10-CM | POA: Insufficient documentation

## 2013-07-11 DIAGNOSIS — I10 Essential (primary) hypertension: Secondary | ICD-10-CM | POA: Insufficient documentation

## 2013-07-11 DIAGNOSIS — F1920 Other psychoactive substance dependence, uncomplicated: Secondary | ICD-10-CM

## 2013-07-11 DIAGNOSIS — F1120 Opioid dependence, uncomplicated: Secondary | ICD-10-CM | POA: Insufficient documentation

## 2013-07-11 DIAGNOSIS — F1420 Cocaine dependence, uncomplicated: Secondary | ICD-10-CM | POA: Insufficient documentation

## 2013-07-11 DIAGNOSIS — F1020 Alcohol dependence, uncomplicated: Secondary | ICD-10-CM | POA: Insufficient documentation

## 2013-07-11 DIAGNOSIS — G589 Mononeuropathy, unspecified: Secondary | ICD-10-CM | POA: Insufficient documentation

## 2013-07-12 ENCOUNTER — Encounter (HOSPITAL_COMMUNITY): Payer: Self-pay | Admitting: Psychology
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
## 2013-07-13 ENCOUNTER — Other Ambulatory Visit (HOSPITAL_COMMUNITY): Payer: Medicare Other

## 2013-07-15 ENCOUNTER — Other Ambulatory Visit (HOSPITAL_COMMUNITY): Payer: Medicare Other

## 2013-07-18 ENCOUNTER — Other Ambulatory Visit (HOSPITAL_COMMUNITY): Payer: Medicare Other

## 2013-07-20 ENCOUNTER — Other Ambulatory Visit (HOSPITAL_COMMUNITY): Payer: Medicare Other

## 2013-07-22 ENCOUNTER — Other Ambulatory Visit (HOSPITAL_COMMUNITY): Payer: Medicare Other

## 2013-07-25 ENCOUNTER — Other Ambulatory Visit (HOSPITAL_COMMUNITY): Payer: Medicare Other

## 2013-07-27 ENCOUNTER — Other Ambulatory Visit (HOSPITAL_COMMUNITY): Payer: Medicare Other

## 2013-07-29 ENCOUNTER — Other Ambulatory Visit (HOSPITAL_COMMUNITY): Payer: Medicare Other

## 2013-08-01 ENCOUNTER — Other Ambulatory Visit (HOSPITAL_COMMUNITY): Payer: Medicare Other

## 2013-08-03 ENCOUNTER — Other Ambulatory Visit (HOSPITAL_COMMUNITY): Payer: Medicare Other

## 2013-08-05 ENCOUNTER — Other Ambulatory Visit (HOSPITAL_COMMUNITY): Payer: Medicare Other

## 2013-08-08 ENCOUNTER — Other Ambulatory Visit (HOSPITAL_COMMUNITY): Payer: Medicare Other

## 2013-08-10 ENCOUNTER — Other Ambulatory Visit (HOSPITAL_COMMUNITY): Payer: Medicare Other | Attending: Psychiatry

## 2013-08-12 ENCOUNTER — Other Ambulatory Visit (HOSPITAL_COMMUNITY): Payer: Medicare Other

## 2013-08-15 ENCOUNTER — Other Ambulatory Visit (HOSPITAL_COMMUNITY): Payer: Medicare Other

## 2013-08-17 ENCOUNTER — Other Ambulatory Visit (HOSPITAL_COMMUNITY): Payer: Medicare Other

## 2013-08-19 ENCOUNTER — Other Ambulatory Visit (HOSPITAL_COMMUNITY): Payer: Medicare Other

## 2013-08-22 ENCOUNTER — Other Ambulatory Visit (HOSPITAL_COMMUNITY): Payer: Medicare Other

## 2013-08-24 ENCOUNTER — Other Ambulatory Visit (HOSPITAL_COMMUNITY): Payer: Medicare Other

## 2013-08-26 ENCOUNTER — Other Ambulatory Visit (HOSPITAL_COMMUNITY): Payer: Medicare Other

## 2013-08-29 ENCOUNTER — Other Ambulatory Visit (HOSPITAL_COMMUNITY): Payer: Medicare Other

## 2013-08-31 ENCOUNTER — Other Ambulatory Visit (HOSPITAL_COMMUNITY): Payer: Medicare Other

## 2013-09-02 ENCOUNTER — Other Ambulatory Visit (HOSPITAL_COMMUNITY): Payer: Medicare Other

## 2013-09-05 ENCOUNTER — Other Ambulatory Visit (HOSPITAL_COMMUNITY): Payer: Medicare Other

## 2013-09-07 ENCOUNTER — Other Ambulatory Visit (HOSPITAL_COMMUNITY): Payer: Medicare Other

## 2013-09-09 ENCOUNTER — Other Ambulatory Visit (HOSPITAL_COMMUNITY): Payer: Medicare Other

## 2013-09-12 ENCOUNTER — Other Ambulatory Visit (HOSPITAL_COMMUNITY): Payer: Medicare Other

## 2013-09-14 ENCOUNTER — Other Ambulatory Visit (HOSPITAL_COMMUNITY): Payer: Medicare Other

## 2013-09-16 ENCOUNTER — Other Ambulatory Visit (HOSPITAL_COMMUNITY): Payer: Medicare Other

## 2013-09-19 ENCOUNTER — Other Ambulatory Visit (HOSPITAL_COMMUNITY): Payer: Medicare Other

## 2013-09-21 ENCOUNTER — Other Ambulatory Visit (HOSPITAL_COMMUNITY): Payer: Medicare Other

## 2013-09-23 ENCOUNTER — Other Ambulatory Visit (HOSPITAL_COMMUNITY): Payer: Medicare Other

## 2013-09-26 ENCOUNTER — Other Ambulatory Visit (HOSPITAL_COMMUNITY): Payer: Medicare Other

## 2013-09-28 ENCOUNTER — Other Ambulatory Visit (HOSPITAL_COMMUNITY): Payer: Medicare Other

## 2013-09-30 ENCOUNTER — Other Ambulatory Visit (HOSPITAL_COMMUNITY): Payer: Medicare Other

## 2014-03-27 DIAGNOSIS — Z8659 Personal history of other mental and behavioral disorders: Secondary | ICD-10-CM | POA: Diagnosis not present

## 2014-03-27 DIAGNOSIS — G8929 Other chronic pain: Secondary | ICD-10-CM | POA: Diagnosis not present

## 2014-03-27 DIAGNOSIS — S61012A Laceration without foreign body of left thumb without damage to nail, initial encounter: Secondary | ICD-10-CM | POA: Diagnosis present

## 2014-03-27 DIAGNOSIS — Z23 Encounter for immunization: Secondary | ICD-10-CM | POA: Diagnosis not present

## 2014-03-27 DIAGNOSIS — G629 Polyneuropathy, unspecified: Secondary | ICD-10-CM | POA: Diagnosis not present

## 2014-03-27 DIAGNOSIS — W270XXA Contact with workbench tool, initial encounter: Secondary | ICD-10-CM | POA: Diagnosis not present

## 2014-03-27 DIAGNOSIS — Y9289 Other specified places as the place of occurrence of the external cause: Secondary | ICD-10-CM | POA: Insufficient documentation

## 2014-03-27 DIAGNOSIS — Z8719 Personal history of other diseases of the digestive system: Secondary | ICD-10-CM | POA: Diagnosis not present

## 2014-03-27 DIAGNOSIS — I10 Essential (primary) hypertension: Secondary | ICD-10-CM | POA: Insufficient documentation

## 2014-03-27 DIAGNOSIS — Z8781 Personal history of (healed) traumatic fracture: Secondary | ICD-10-CM | POA: Diagnosis not present

## 2014-03-27 DIAGNOSIS — Z79899 Other long term (current) drug therapy: Secondary | ICD-10-CM | POA: Insufficient documentation

## 2014-03-27 DIAGNOSIS — Z7982 Long term (current) use of aspirin: Secondary | ICD-10-CM | POA: Diagnosis not present

## 2014-03-27 DIAGNOSIS — Y998 Other external cause status: Secondary | ICD-10-CM | POA: Insufficient documentation

## 2014-03-27 DIAGNOSIS — M1990 Unspecified osteoarthritis, unspecified site: Secondary | ICD-10-CM | POA: Insufficient documentation

## 2014-03-27 DIAGNOSIS — Y9389 Activity, other specified: Secondary | ICD-10-CM | POA: Insufficient documentation

## 2014-03-27 DIAGNOSIS — Z720 Tobacco use: Secondary | ICD-10-CM | POA: Insufficient documentation

## 2014-03-28 ENCOUNTER — Emergency Department (HOSPITAL_COMMUNITY)
Admission: EM | Admit: 2014-03-28 | Discharge: 2014-03-28 | Disposition: A | Discharge status: Home / Self Care | Payer: Medicare Other | Attending: Emergency Medicine | Admitting: Emergency Medicine

## 2014-03-28 ENCOUNTER — Encounter (HOSPITAL_COMMUNITY): Payer: Self-pay | Admitting: Emergency Medicine

## 2014-03-28 DIAGNOSIS — S61412A Laceration without foreign body of left hand, initial encounter: Secondary | ICD-10-CM

## 2014-03-28 MED ORDER — IBUPROFEN 400 MG PO TABS
800.0000 mg | ORAL_TABLET | Freq: Once | ORAL | Status: DC
  Filled 2014-03-28: qty 4

## 2014-03-28 MED ORDER — TETANUS-DIPHTH-ACELL PERTUSSIS 5-2.5-18.5 LF-MCG/0.5 IM SUSP
0.5000 mL | Freq: Once | INTRAMUSCULAR | Status: AC
  Administered 2014-03-28: 0.5 mL via INTRAMUSCULAR
  Filled 2014-03-28: qty 0.5

## 2014-03-28 MED ORDER — KETOROLAC TROMETHAMINE 60 MG/2ML IM SOLN
60.0000 mg | Freq: Once | INTRAMUSCULAR | Status: AC
  Administered 2014-03-28: 60 mg via INTRAMUSCULAR
  Filled 2014-03-28: qty 2

## 2014-03-28 MED ORDER — SULFAMETHOXAZOLE-TRIMETHOPRIM 800-160 MG PO TABS: 1.0000 | ORAL_TABLET | Freq: Two times a day (BID) | ORAL | Status: AC

## 2014-03-28 NOTE — ED Notes (Signed)
Patient with left thumb laceration on a hand saw that happened yesterday.  Patient having continued pain.  Laceration is not bleeding.

## 2014-03-28 NOTE — Discharge Instructions (Signed)
Return to the emergency room with worsening of symptoms, new symptoms or with symptoms that are concerning, especially fevers, chills, redness, swelling, pus discharge or red streaks from laceration. Follow up with URGENT care as above. Please take all of your antibiotics until finished!   You may develop abdominal discomfort or diarrhea from the antibiotic.  You may help offset this with probiotics which you can buy or get in yogurt. Do not eat  or take the probiotics until 2 hours after your antibiotic.   Keep wound dry and do not remove dressing for 24 hours if possible. After that, wash gently morning and night (every 12 hours) with soap and water. Use a topical antibiotic ointment and cover with a bandaid or gauze.    Do NOT use rubbing alcohol or hydrogen peroxide, do not soak the area    Every attempt was made to remove foreign body (contaminants) from the wound.  However, there is always a chance that some may remain in the wound. This can  increase your risk of infection.   If you see signs of infection (warmth, redness, tenderness, pus, sharp increase in pain, fever, red streaking in the skin) immediately return to the emergency department.   After the wound heals fully, apply sunscreen for 6-12 months to minimize scarring.
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
## 2014-03-28 NOTE — ED Provider Notes (Signed)
Medical screening examination/treatment/procedure(s) were performed by non-physician practitioner and as supervising physician I was immediately available for consultation/collaboration.   Dione Boozeavid Sabina Beavers, MD 03/28/14 [phone]

## 2014-07-02 ENCOUNTER — Emergency Department (HOSPITAL_COMMUNITY)
Admission: EM | Admit: 2014-07-02 | Discharge: 2014-07-03 | Disposition: A | Discharge status: Home / Self Care | Payer: Medicare Other | Attending: Emergency Medicine | Admitting: Emergency Medicine

## 2014-07-02 ENCOUNTER — Emergency Department (HOSPITAL_COMMUNITY)
Admission: EM | Admit: 2014-07-02 | Discharge: 2014-07-02 | Discharge status: Absent without leave | Payer: No Typology Code available for payment source

## 2014-07-02 ENCOUNTER — Encounter (HOSPITAL_COMMUNITY): Payer: Self-pay

## 2014-07-02 DIAGNOSIS — F1020 Alcohol dependence, uncomplicated: Secondary | ICD-10-CM | POA: Diagnosis not present

## 2014-07-02 DIAGNOSIS — F1994 Other psychoactive substance use, unspecified with psychoactive substance-induced mood disorder: Secondary | ICD-10-CM | POA: Diagnosis present

## 2014-07-02 DIAGNOSIS — F1924 Other psychoactive substance dependence with psychoactive substance-induced mood disorder: Secondary | ICD-10-CM | POA: Insufficient documentation

## 2014-07-02 DIAGNOSIS — M1990 Unspecified osteoarthritis, unspecified site: Secondary | ICD-10-CM | POA: Insufficient documentation

## 2014-07-02 DIAGNOSIS — Z79899 Other long term (current) drug therapy: Secondary | ICD-10-CM | POA: Insufficient documentation

## 2014-07-02 DIAGNOSIS — Z7982 Long term (current) use of aspirin: Secondary | ICD-10-CM | POA: Insufficient documentation

## 2014-07-02 DIAGNOSIS — I10 Essential (primary) hypertension: Secondary | ICD-10-CM | POA: Insufficient documentation

## 2014-07-02 DIAGNOSIS — Z720 Tobacco use: Secondary | ICD-10-CM | POA: Diagnosis not present

## 2014-07-02 DIAGNOSIS — F10230 Alcohol dependence with withdrawal, uncomplicated: Secondary | ICD-10-CM | POA: Insufficient documentation

## 2014-07-02 DIAGNOSIS — F1310 Sedative, hypnotic or anxiolytic abuse, uncomplicated: Secondary | ICD-10-CM | POA: Insufficient documentation

## 2014-07-02 DIAGNOSIS — R45850 Homicidal ideations: Secondary | ICD-10-CM | POA: Diagnosis not present

## 2014-07-02 DIAGNOSIS — G8929 Other chronic pain: Secondary | ICD-10-CM | POA: Insufficient documentation

## 2014-07-02 DIAGNOSIS — G629 Polyneuropathy, unspecified: Secondary | ICD-10-CM | POA: Insufficient documentation

## 2014-07-02 DIAGNOSIS — F1410 Cocaine abuse, uncomplicated: Secondary | ICD-10-CM | POA: Insufficient documentation

## 2014-07-02 DIAGNOSIS — F329 Major depressive disorder, single episode, unspecified: Secondary | ICD-10-CM | POA: Insufficient documentation

## 2014-07-02 DIAGNOSIS — F1920 Other psychoactive substance dependence, uncomplicated: Secondary | ICD-10-CM | POA: Diagnosis present

## 2014-07-02 DIAGNOSIS — R45851 Suicidal ideations: Secondary | ICD-10-CM

## 2014-07-02 DIAGNOSIS — R443 Hallucinations, unspecified: Secondary | ICD-10-CM | POA: Diagnosis present

## 2014-07-02 LAB — SALICYLATE LEVEL

## 2014-07-02 LAB — CBC
HCT: 44.8 % (ref 39.0–52.0)
HEMOGLOBIN: 15.2 g/dL (ref 13.0–17.0)
MCH: 29.8 pg (ref 26.0–34.0)
MCHC: 33.9 g/dL (ref 30.0–36.0)
MCV: 87.8 fL (ref 78.0–100.0)
Platelets: 226 10*3/uL (ref 150–400)
RBC: 5.1 MIL/uL (ref 4.22–5.81)
RDW: 13.9 % (ref 11.5–15.5)
WBC: 7.4 10*3/uL (ref 4.0–10.5)

## 2014-07-02 LAB — COMPREHENSIVE METABOLIC PANEL
ALBUMIN: 4.2 g/dL (ref 3.5–5.2)
ALK PHOS: 65 U/L (ref 39–117)
ALT: 27 U/L (ref 0–53)
ANION GAP: 9 (ref 5–15)
AST: 40 U/L — ABNORMAL HIGH (ref 0–37)
BILIRUBIN TOTAL: 0.6 mg/dL (ref 0.3–1.2)
BUN: 13 mg/dL (ref 6–23)
CALCIUM: 9.2 mg/dL (ref 8.4–10.5)
CO2: 26 mmol/L (ref 19–32)
CREATININE: 0.97 mg/dL (ref 0.50–1.35)
Chloride: 102 mmol/L (ref 96–112)
GFR calc Af Amer: >90 mL/min (ref >90)
GFR calc non Af Amer: >90 mL/min (ref >90)
GLUCOSE: 94 mg/dL (ref 70–99)
Potassium: 3.7 mmol/L (ref 3.5–5.1)
Sodium: 137 mmol/L (ref 135–145)
TOTAL PROTEIN: 7.2 g/dL (ref 6.0–8.3)

## 2014-07-02 LAB — RAPID URINE DRUG SCREEN, HOSP PERFORMED
Amphetamines: NOT DETECTED
BARBITURATES: NOT DETECTED
BENZODIAZEPINES: POSITIVE — AB
Cocaine: POSITIVE — AB
OPIATES: NOT DETECTED
Tetrahydrocannabinol: NOT DETECTED

## 2014-07-02 LAB — ETHANOL: Alcohol, Ethyl (B): 26 mg/dL — ABNORMAL HIGH (ref 0–9)

## 2014-07-02 LAB — ACETAMINOPHEN LEVEL: Acetaminophen (Tylenol), Serum: <10 ug/mL — ABNORMAL LOW (ref 10–30)

## 2014-07-02 MED ORDER — LORAZEPAM 1 MG PO TABS: 0.0000 mg | ORAL_TABLET | Freq: Two times a day (BID) | ORAL | Status: DC

## 2014-07-02 MED ORDER — LORAZEPAM 1 MG PO TABS: 0.0000 mg | ORAL_TABLET | Freq: Four times a day (QID) | ORAL | Status: DC

## 2014-07-02 NOTE — ED Notes (Signed)
Per pt,  Was clean x 11 years.  Relapsed into alcohol and drugs x  6 months ago.  Pt drinks 12 pack per day.  Uses crack, cocaine, oxycodone valium.  Pt requesting detox program.  Denies SI adamantly.  Pt does state HI towards a few people.  Dealers that are showing him disrespect and ripping him off.  When questioned about having hallucinations, pt states visual and auditory.  Saw ghost in a house.
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
## 2014-07-02 NOTE — ED Notes (Signed)
Josephine NP into see 

## 2014-07-02 NOTE — ED Notes (Signed)
Report received from Janie Rambo RN. Pt. Alert and oriented in no distress denies SI, HI, AVH and pain.  Pt. Instructed to come to me with problems or concerns.Will continue to monitor for safety via security cameras and Q 15 minute checks. 

## 2014-07-02 NOTE — ED Notes (Signed)
Pt ambulatory w/o difficulty to room 37 
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
## 2014-07-02 NOTE — BH Assessment (Addendum)
Writer spoke w/ Dahlia ByesJosephine Onuoha re pt's disposition. Writer then spoke w/ EDP Gwendolyn GrantWalden who reports he just spoke w/ Dahlia ByesJosephine Onuoha. The updated disposition is for pt to be kept overnight for re evaluation by psych am of 2/22.   Evette Cristalaroline Paige Paddy [place], [place] Assessment Counselor [phone]   [facility] as his having Medicare disqualifies him. Writer discussed pt disposition with EDP Gwendolyn GrantWalden. Gwendolyn GrantWalden agrees with Clinical research associatewriter that pt can be d/c with outpatient resources. TTS Olivia Mackieressa to provide list of SA resources.  Evette Cristalaroline Paige Alvira Hecht, [place] Assessment Counselor [phone]

## 2014-07-02 NOTE — ED Notes (Signed)
Pt denies si/hi/avh at this time

## 2014-07-02 NOTE — ED Notes (Signed)
Delay in lab draw, pt changing into scrubs 

## 2014-07-03 DIAGNOSIS — F1994 Other psychoactive substance use, unspecified with psychoactive substance-induced mood disorder: Secondary | ICD-10-CM | POA: Diagnosis not present

## 2014-07-03 DIAGNOSIS — F1410 Cocaine abuse, uncomplicated: Secondary | ICD-10-CM | POA: Diagnosis not present

## 2014-07-03 DIAGNOSIS — F10230 Alcohol dependence with withdrawal, uncomplicated: Secondary | ICD-10-CM | POA: Diagnosis present
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
## 2014-07-03 NOTE — [facility] Suicide Risk Assessment (Signed)
Suicide Risk Assessment  Discharge Assessment   [facility]   Demographic Factors:  Male  Total Time spent with patient: 30 minutes  Musculoskeletal: Strength & Muscle Tone: within normal limits Gait & Station: normal Patient leans: N/A  Psychiatric Specialty Exam:     Blood pressure 154/88, pulse 70, temperature 98.2 F (36.8 C), temperature source Oral, resp. rate 18, SpO2 99 %.There is no weight on file to calculate BMI.  General Appearance: Casual  Eye Contact::  None  Speech:  Normal  Volume:  Normal  Mood:  Euthymic  Affect:  Congruent  Thought Process:  Coherent  Orientation:  Alert and oriented  Thought Content:  Clear and coherent  Suicidal Thoughts:  No  Homicidal Thoughts:  No  Memory:  Good  Judgement: Fair  Insight:  Fair  Psychomotor Activity:  Normal  Concentration:  Good  Recall:  Good  Fund of Knowledge: Good  Language: Good  Akathisia:  NA  Handed:  Right  AIMS (if indicated):     Assets:  Desire for Improvement  ADL's: Intact  Cognition: WNL  Sleep:        Has this patient used any form of tobacco in the last 30 days? (Cigarettes, Smokeless Tobacco, Cigars, and/or Pipes) No  Mental Status Per Nursing Assessment::   On Admission:   Alcohol and cocaine abuse  Current Mental Status by Physician: NA  Loss Factors: NA  Historical Factors: NA  Risk Reduction Factors:   Sense of responsibility to family and Living with another person, especially a relative  Continued Clinical Symptoms:  None  Cognitive Features That Contribute To Risk:  None    Suicide Risk:  Minimal: No identifiable suicidal ideation.  Patients presenting with no risk factors but with morbid ruminations; may be classified as minimal risk based on the severity of the depressive symptoms  Principal Problem: Alcohol dependence with uncomplicated withdrawal Discharge Diagnoses:  Patient Active Problem List   Diagnosis Date Noted  . Alcohol  dependence with uncomplicated withdrawal [F10.230] 07/03/2014    Priority: High  . Cocaine abuse [F14.10] 07/03/2014    Priority: High  . Polysubstance dependence, non-opioid, continuous [F19.20] 07/01/2013    Priority: High  . Substance induced mood disorder [F19.94] 07/01/2013    Priority: High  . Preventative health care [Z00.00] 02/23/2011  . Peripheral vascular disease [I73.9] 01/14/2011  . GERD (gastroesophageal reflux disease) [K21.9] 12/27/2010  . Headache [R51] 12/27/2010  . Hypertension [I10] 12/25/2010  . Back pain [M54.9] 12/25/2010      Plan Of Care/Follow-up recommendations:  Activity:  as tolerated Diet:  heart healthy diet  Is patient on multiple antipsychotic therapies at discharge:  No   Has Patient had three or more failed trials of antipsychotic monotherapy by history:  No  Recommended Plan for Multiple Antipsychotic Therapies: NA    LORD, JAMISON, PMH-NP 07/03/2014, 5:05 PM

## 2014-07-03 NOTE — BH Assessment (Addendum)
[facility] Assessment Progress Note  Per Thedore MinsMojeed Akintayo, MD this pt does not require psychiatric hospitalization at this time. He is to be discharged with referral [facility] and for area substance abuse treatment providers. This information has been included in pt's discharge instructions. Pt's nurse, Dawnaly, has been notified.  Adam Canninghomas Legacie Dillingham, MA Triage Specialist 07/03/2014 @ 10:32

## 2014-07-03 NOTE — ED Notes (Signed)
Patient was told this morning that he was being discharged today.  States [facility] and he brought all his belongings with him.  Discussed case with Janice Coffinom Hughes.  He suggested we could give the patient a cab voucher this one time.  Patient called around looking for treatment beds and was able to procure and intake [facility] at 8:00 tomorrow morning.  He understands that this is not a guarantee of admission, but is hopeful they will have a bed.  Cab voucher obtained from social work to [place] [facility] [place].

## 2014-07-03 NOTE — Discharge Instructions (Addendum)
For your ongoing behavioral health needs, you are advised to follow up with [facility].  [facility] sees new patients through their walk-in clinic.  Walk-in hours are Monday - Friday, 8:00 am - 3:00 pm.  Walk-in patients are seen on a first come, first served basis.  Try to arrive as early as possible for the best chance of being seen the same day.       [facility]      [address]      [place], [place] [address]      [phone]  To help you maintain a sober lifestyle, a substance abuse treatment program may be helpful to you.  Contact the following treatment providers to see about enrolling in their programs:  RESIDENTIAL:       [facility]      [address]      [place], [place] [address]      [phone]       [facility]      [address]      [place], [place] [address]      [phone]  OUTPATIENT:       [facility]      [address]      [place], [place] [address]      [phone]
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
## 2014-08-06 ENCOUNTER — Encounter (HOSPITAL_COMMUNITY): Payer: Self-pay | Admitting: Emergency Medicine

## 2014-08-06 ENCOUNTER — Emergency Department (HOSPITAL_COMMUNITY)
Admission: EM | Admit: 2014-08-06 | Discharge: 2014-08-07 | Disposition: A | Discharge status: Home / Self Care | Payer: Medicare Other | Attending: Emergency Medicine | Admitting: Emergency Medicine

## 2014-08-06 DIAGNOSIS — S022XXA Fracture of nasal bones, initial encounter for closed fracture: Secondary | ICD-10-CM | POA: Diagnosis not present

## 2014-08-06 DIAGNOSIS — Z7982 Long term (current) use of aspirin: Secondary | ICD-10-CM | POA: Diagnosis not present

## 2014-08-06 DIAGNOSIS — S0993XA Unspecified injury of face, initial encounter: Secondary | ICD-10-CM | POA: Insufficient documentation

## 2014-08-06 DIAGNOSIS — S0992XA Unspecified injury of nose, initial encounter: Secondary | ICD-10-CM | POA: Diagnosis present

## 2014-08-06 DIAGNOSIS — Y998 Other external cause status: Secondary | ICD-10-CM | POA: Insufficient documentation

## 2014-08-06 DIAGNOSIS — I10 Essential (primary) hypertension: Secondary | ICD-10-CM | POA: Diagnosis not present

## 2014-08-06 DIAGNOSIS — Z720 Tobacco use: Secondary | ICD-10-CM | POA: Insufficient documentation

## 2014-08-06 DIAGNOSIS — S01119A Laceration without foreign body of unspecified eyelid and periocular area, initial encounter: Secondary | ICD-10-CM | POA: Diagnosis not present

## 2014-08-06 DIAGNOSIS — Z79899 Other long term (current) drug therapy: Secondary | ICD-10-CM | POA: Diagnosis not present

## 2014-08-06 DIAGNOSIS — T148XXA Other injury of unspecified body region, initial encounter: Secondary | ICD-10-CM

## 2014-08-06 DIAGNOSIS — F329 Major depressive disorder, single episode, unspecified: Secondary | ICD-10-CM | POA: Insufficient documentation

## 2014-08-06 DIAGNOSIS — G8929 Other chronic pain: Secondary | ICD-10-CM | POA: Diagnosis not present

## 2014-08-06 DIAGNOSIS — Y92009 Unspecified place in unspecified non-institutional (private) residence as the place of occurrence of the external cause: Secondary | ICD-10-CM | POA: Insufficient documentation

## 2014-08-06 DIAGNOSIS — M1990 Unspecified osteoarthritis, unspecified site: Secondary | ICD-10-CM | POA: Diagnosis not present

## 2014-08-06 DIAGNOSIS — Y9389 Activity, other specified: Secondary | ICD-10-CM | POA: Insufficient documentation

## 2014-08-06 DIAGNOSIS — R6884 Jaw pain: Secondary | ICD-10-CM

## 2014-08-06 MED ORDER — OXYCODONE HCL 5 MG PO TABS
5.0000 mg | ORAL_TABLET | ORAL | Status: AC
  Administered 2014-08-06: 5 mg via ORAL
  Filled 2014-08-06: qty 1
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
## 2014-08-07 ENCOUNTER — Emergency Department (HOSPITAL_COMMUNITY): Payer: Medicare Other

## 2014-08-07 DIAGNOSIS — S022XXA Fracture of nasal bones, initial encounter for closed fracture: Secondary | ICD-10-CM | POA: Diagnosis not present

## 2014-08-07 MED ORDER — IBUPROFEN 400 MG PO TABS
600.0000 mg | ORAL_TABLET | Freq: Once | ORAL | Status: DC
  Filled 2014-08-07 (×2): qty 1

## 2014-08-07 NOTE — ED Notes (Signed)
Patient transported to CT 
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
## 2014-08-07 NOTE — Discharge Instructions (Signed)
Abrasions An abrasion is a cut or scrape of the skin. Abrasions do not go through all layers of the skin. HOME CARE  If a bandage (dressing) was put on your wound, change it as told by your doctor. If the bandage sticks, soak it off with warm.  Wash the area with water and soap 2 times a day. Rinse off the soap. Pat the area dry with a clean towel.  Put on medicated cream (ointment) as told by your doctor.  Change your bandage right away if it gets wet or dirty.  Only take medicine as told by your doctor.  See your doctor within 24-48 hours to get your wound checked.  Check your wound for redness, puffiness (swelling), or yellowish-white fluid (pus). GET HELP RIGHT AWAY IF:   You have more pain in the wound.  You have redness, swelling, or tenderness around the wound.  You have pus coming from the wound.  You have a fever or lasting symptoms for more than 2-3 days.  You have a fever and your symptoms suddenly get worse.  You have a bad smell coming from the wound or bandage. MAKE SURE YOU:   Understand these instructions.  Will watch your condition.  Will get help right away if you are not doing well or get worse. Document Released: 10/15/2007 Document Revised: 01/21/2012 Document Reviewed: 04/01/2011 [facility] Patient Information 2015 [place], [place]. This information is not intended to replace advice given to you by your health care provider. Make sure you discuss any questions you have with your health care provider.  Assault, General Assault includes any behavior, whether intentional or reckless, which results in bodily injury to another person and/or damage to property. Included in this would be any behavior, intentional or reckless, that by its nature would be understood (interpreted) by a reasonable person as intent to harm another person or to damage his/her property. Threats may be oral or written. They may be communicated through regular mail, computer, fax, or  phone. These threats may be direct or implied. FORMS OF ASSAULT INCLUDE:  Physically assaulting a person. This includes physical threats to inflict physical harm as well as:  Slapping.  Hitting.  Poking.  Kicking.  Punching.  Pushing.  Arson.  Sabotage.  Equipment vandalism.  Damaging or destroying property.  Throwing or hitting objects.  Displaying a weapon or an object that appears to be a weapon in a threatening manner.  Carrying a firearm of any kind.  Using a weapon to harm someone.  Using greater physical size/strength to intimidate another.  Making intimidating or threatening gestures.  Bullying.  Hazing.  Intimidating, threatening, hostile, or abusive language directed toward another person.  It communicates the intention to engage in violence against that person. And it leads a reasonable person to expect that violent behavior may occur.  Stalking another person. IF IT HAPPENS AGAIN:  Immediately call for emergency help (911 in [place]  If someone poses clear and immediate danger to you, seek legal authorities to have a protective or restraining order put in place.  Less threatening assaults can at least be reported to authorities. STEPS TO TAKE IF A SEXUAL ASSAULT HAS HAPPENED  Go to an area of safety. This may include a shelter or staying with a friend. Stay away from the area where you have been attacked. A large percentage of sexual assaults are caused by a friend, relative or associate.  If medications were given by your caregiver, take them as directed for the full  length of time prescribed.  Only take over-the-counter or prescription medicines for pain, discomfort, or fever as directed by your caregiver.  If you have come in contact with a sexual disease, find out if you are to be tested again. If your caregiver is concerned about the HIV/AIDS virus, he/she may require you to have continued testing for several months.  For the protection  of your privacy, test results can not be given over the phone. Make sure you receive the results of your test. If your test results are not back during your visit, make an appointment with your caregiver to find out the results. Do not assume everything is normal if you have not heard from your caregiver or the medical facility. It is important for you to follow up on all of your test results.  File appropriate papers with authorities. This is important in all assaults, even if it has occurred in a family or by a friend. SEEK MEDICAL CARE IF:  You have new problems because of your injuries.  You have problems that may be because of the medicine you are taking, such as:  Rash.  Itching.  Swelling.  Trouble breathing.  You develop belly (abdominal) pain, feel sick to your stomach (nausea) or are vomiting.  You begin to run a temperature.  You need supportive care or referral to a rape crisis center. These are centers with trained personnel who can help you get through this ordeal. SEEK IMMEDIATE MEDICAL CARE IF:  You are afraid of being threatened, beaten, or abused. In [place] call 911.  You receive new injuries related to abuse.  You develop severe pain in any area injured in the assault or have any change in your condition that concerns you.  You faint or lose consciousness.  You develop chest pain or shortness of breath. Document Released: 04/28/2005 Document Revised: 07/21/2011 Document Reviewed: 12/15/2007 [facility] Patient Information 2015 [place], [place]. This information is not intended to replace advice given to you by your health care provider. Make sure you discuss any questions you have with your health care provider. Your head CT is normal, you do not have an intracranial braid or any fractures in your scalp, or jaw , but you do have a new, namely displaced nasal bone fracture you have pain medication and that he can take on a regular basis.  Please follow-up with your  primary care physician, or Dr. Emeline DarlingGore, ENT You also can use ice to the area as well as ibuprofen Cryotherapy Cryotherapy means treatment with cold. Ice or gel packs can be used to reduce both pain and swelling. Ice is the most helpful within the first 24 to 48 hours after an injury or flare-up from overusing a muscle or joint. Sprains, strains, spasms, burning pain, shooting pain, and aches can all be eased with ice. Ice can also be used when recovering from surgery. Ice is effective, has very few side effects, and is safe for most people to use. PRECAUTIONS  Ice is not a safe treatment option for people with:  Raynaud phenomenon. This is a condition affecting small blood vessels in the extremities. Exposure to cold may cause your problems to return.  Cold hypersensitivity. There are many forms of cold hypersensitivity, including:  Cold urticaria. Red, itchy hives appear on the skin when the tissues begin to warm after being iced.  Cold erythema. This is a red, itchy rash caused by exposure to cold.  Cold hemoglobinuria. Red blood cells break down when the tissues  begin to warm after being iced. The hemoglobin that carry oxygen are passed into the urine because they cannot combine with blood proteins fast enough.  Numbness or altered sensitivity in the area being iced. If you have any of the following conditions, do not use ice until you have discussed cryotherapy with your caregiver:  Heart conditions, such as arrhythmia, angina, or chronic heart disease.  High blood pressure.  Healing wounds or open skin in the area being iced.  Current infections.  Rheumatoid arthritis.  Poor circulation.  Diabetes. Ice slows the blood flow in the region it is applied. This is beneficial when trying to stop inflamed tissues from spreading irritating chemicals to surrounding tissues. However, if you expose your skin to cold temperatures for too long or without the proper protection, you can damage  your skin or nerves. Watch for signs of skin damage due to cold. HOME CARE INSTRUCTIONS Follow these tips to use ice and cold packs safely.  Place a dry or damp towel between the ice and skin. A damp towel will cool the skin more quickly, so you may need to shorten the time that the ice is used.  For a more rapid response, add gentle compression to the ice.  Ice for no more than 10 to 20 minutes at a time. The bonier the area you are icing, the less time it will take to get the benefits of ice.  Check your skin after 5 minutes to make sure there are no signs of a poor response to cold or skin damage.  Rest 20 minutes or more between uses.  Once your skin is numb, you can end your treatment. You can test numbness by very lightly touching your skin. The touch should be so light that you do not see the skin dimple from the pressure of your fingertip. When using ice, most people will feel these normal sensations in this order: cold, burning, aching, and numbness.  Do not use ice on someone who cannot communicate their responses to pain, such as small children or people with dementia. HOW TO MAKE AN ICE PACK Ice packs are the most common way to use ice therapy. Other methods include ice massage, ice baths, and cryosprays. Muscle creams that cause a cold, tingly feeling do not offer the same benefits that ice offers and should not be used as a substitute unless recommended by your caregiver. To make an ice pack, do one of the following:  Place crushed ice or a bag of frozen vegetables in a sealable plastic bag. Squeeze out the excess air. Place this bag inside another plastic bag. Slide the bag into a pillowcase or place a damp towel between your skin and the bag.  Mix 3 parts water with 1 part rubbing alcohol. Freeze the mixture in a sealable plastic bag. When you remove the mixture from the freezer, it will be slushy. Squeeze out the excess air. Place this bag inside another plastic bag. Slide the  bag into a pillowcase or place a damp towel between your skin and the bag. SEEK MEDICAL CARE IF:  You develop white spots on your skin. This may give the skin a blotchy (mottled) appearance.  Your skin turns blue or pale.  Your skin becomes waxy or hard.  Your swelling gets worse. MAKE SURE YOU:   Understand these instructions.  Will watch your condition.  Will get help right away if you are not doing well or get worse. Document Released: 12/23/2010 Document Revised: 09/12/2013  Document Reviewed: 12/23/2010 [facility] Patient Information 2015 [place], [place]. This information is not intended to replace advice given to you by your health care provider. Make sure you discuss any questions you have with your health care provider.

## 2015-01-11 ENCOUNTER — Other Ambulatory Visit: Payer: Self-pay | Admitting: Pain Medicine

## 2015-01-11 DIAGNOSIS — M545 Low back pain: Secondary | ICD-10-CM

## 2015-01-11 DIAGNOSIS — M542 Cervicalgia: Secondary | ICD-10-CM

## 2015-01-24 ENCOUNTER — Other Ambulatory Visit: Payer: Self-pay

## 2015-01-28 ENCOUNTER — Other Ambulatory Visit: Payer: Self-pay

## 2015-02-06 ENCOUNTER — Ambulatory Visit
Admission: RE | Admit: 2015-02-06 | Discharge: 2015-02-06 | Disposition: A | Discharge status: Home / Self Care | Payer: PRIVATE HEALTH INSURANCE | Source: Ambulatory Visit | Attending: Pain Medicine | Admitting: Pain Medicine

## 2015-02-06 DIAGNOSIS — M545 Low back pain: Secondary | ICD-10-CM

## 2015-02-06 DIAGNOSIS — M542 Cervicalgia: Secondary | ICD-10-CM

## 2015-06-20 ENCOUNTER — Emergency Department (HOSPITAL_COMMUNITY): Payer: Medicare Other

## 2015-06-20 ENCOUNTER — Encounter (HOSPITAL_COMMUNITY): Payer: Self-pay | Admitting: Emergency Medicine

## 2015-06-20 ENCOUNTER — Emergency Department (HOSPITAL_COMMUNITY)
Admission: EM | Admit: 2015-06-20 | Discharge: 2015-06-20 | Disposition: A | Discharge status: Home / Self Care | Payer: Medicare Other | Attending: Emergency Medicine | Admitting: Emergency Medicine

## 2015-06-20 DIAGNOSIS — G8929 Other chronic pain: Secondary | ICD-10-CM | POA: Insufficient documentation

## 2015-06-20 DIAGNOSIS — Y998 Other external cause status: Secondary | ICD-10-CM | POA: Insufficient documentation

## 2015-06-20 DIAGNOSIS — Y9289 Other specified places as the place of occurrence of the external cause: Secondary | ICD-10-CM | POA: Diagnosis not present

## 2015-06-20 DIAGNOSIS — I10 Essential (primary) hypertension: Secondary | ICD-10-CM | POA: Diagnosis not present

## 2015-06-20 DIAGNOSIS — S0101XA Laceration without foreign body of scalp, initial encounter: Secondary | ICD-10-CM

## 2015-06-20 DIAGNOSIS — M1990 Unspecified osteoarthritis, unspecified site: Secondary | ICD-10-CM | POA: Insufficient documentation

## 2015-06-20 DIAGNOSIS — G629 Polyneuropathy, unspecified: Secondary | ICD-10-CM | POA: Insufficient documentation

## 2015-06-20 DIAGNOSIS — Y9389 Activity, other specified: Secondary | ICD-10-CM | POA: Diagnosis not present

## 2015-06-20 DIAGNOSIS — S0181XA Laceration without foreign body of other part of head, initial encounter: Secondary | ICD-10-CM | POA: Insufficient documentation

## 2015-06-20 DIAGNOSIS — Z8719 Personal history of other diseases of the digestive system: Secondary | ICD-10-CM | POA: Diagnosis not present

## 2015-06-20 DIAGNOSIS — F17210 Nicotine dependence, cigarettes, uncomplicated: Secondary | ICD-10-CM | POA: Diagnosis not present

## 2015-06-20 DIAGNOSIS — Z7982 Long term (current) use of aspirin: Secondary | ICD-10-CM | POA: Diagnosis not present

## 2015-06-20 DIAGNOSIS — S0990XA Unspecified injury of head, initial encounter: Secondary | ICD-10-CM | POA: Diagnosis present

## 2015-06-20 DIAGNOSIS — F329 Major depressive disorder, single episode, unspecified: Secondary | ICD-10-CM | POA: Diagnosis not present

## 2015-06-20 DIAGNOSIS — Z79899 Other long term (current) drug therapy: Secondary | ICD-10-CM | POA: Diagnosis not present

## 2015-06-20 DIAGNOSIS — S20212A Contusion of left front wall of thorax, initial encounter: Secondary | ICD-10-CM | POA: Insufficient documentation

## 2015-06-20 LAB — CBC WITH DIFFERENTIAL/PLATELET
BASOS ABS: 0 10*3/uL (ref 0.0–0.1)
Basophils Relative: 0 %
Eosinophils Absolute: 0 10*3/uL (ref 0.0–0.7)
Eosinophils Relative: 0 %
HEMATOCRIT: 44.9 % (ref 39.0–52.0)
HEMOGLOBIN: 15.6 g/dL (ref 13.0–17.0)
LYMPHS PCT: 28 %
Lymphs Abs: 2.7 10*3/uL (ref 0.7–4.0)
MCH: 29.9 pg (ref 26.0–34.0)
MCHC: 34.7 g/dL (ref 30.0–36.0)
MCV: 86.2 fL (ref 78.0–100.0)
Monocytes Absolute: 0.4 10*3/uL (ref 0.1–1.0)
Monocytes Relative: 4 %
NEUTROS ABS: 6.6 10*3/uL (ref 1.7–7.7)
Neutrophils Relative %: 68 %
Platelets: 251 10*3/uL (ref 150–400)
RBC: 5.21 MIL/uL (ref 4.22–5.81)
RDW: 14.8 % (ref 11.5–15.5)
WBC: 9.7 10*3/uL (ref 4.0–10.5)

## 2015-06-20 LAB — COMPREHENSIVE METABOLIC PANEL
ALT: 35 U/L (ref 17–63)
AST: 35 U/L (ref 15–41)
Albumin: 4.5 g/dL (ref 3.5–5.0)
Alkaline Phosphatase: 64 U/L (ref 38–126)
Anion gap: 14 (ref 5–15)
BILIRUBIN TOTAL: 0.8 mg/dL (ref 0.3–1.2)
BUN: 20 mg/dL (ref 6–20)
CHLORIDE: 108 mmol/L (ref 101–111)
CO2: 21 mmol/L — ABNORMAL LOW (ref 22–32)
CREATININE: 1.13 mg/dL (ref 0.61–1.24)
Calcium: 9.7 mg/dL (ref 8.9–10.3)
GFR calc Af Amer: >60 mL/min (ref >60)
Glucose, Bld: 94 mg/dL (ref 65–99)
Potassium: 4 mmol/L (ref 3.5–5.1)
Sodium: 143 mmol/L (ref 135–145)
TOTAL PROTEIN: 7.7 g/dL (ref 6.5–8.1)

## 2015-06-20 LAB — ETHANOL: ALCOHOL ETHYL (B): 84 mg/dL — AB (ref <5)

## 2015-06-20 MED ORDER — LIDOCAINE-EPINEPHRINE-TETRACAINE (LET) SOLUTION
3.0000 mL | Freq: Once | NASAL | Status: AC
  Administered 2015-06-20: 3 mL via TOPICAL
  Filled 2015-06-20: qty 3

## 2015-06-20 MED ORDER — CYCLOBENZAPRINE HCL 10 MG PO TABS: 10.0000 mg | ORAL_TABLET | Freq: Two times a day (BID) | ORAL | Status: AC | PRN

## 2015-06-20 MED ORDER — OXYCODONE HCL 5 MG PO TABS: 5.0000 mg | ORAL_TABLET | ORAL | Status: DC | PRN

## 2015-06-20 MED ORDER — MORPHINE SULFATE (PF) 4 MG/ML IV SOLN
4.0000 mg | Freq: Once | INTRAVENOUS | Status: AC
  Administered 2015-06-20: 4 mg via INTRAMUSCULAR
  Filled 2015-06-20: qty 1

## 2015-06-20 MED ORDER — LIDOCAINE-EPINEPHRINE (PF) 2 %-1:200000 IJ SOLN
20.0000 mL | Freq: Once | INTRAMUSCULAR | Status: AC
  Administered 2015-06-20: 20 mL
  Filled 2015-06-20: qty 20

## 2015-06-20 NOTE — ED Notes (Signed)
Pt in CT.

## 2015-06-20 NOTE — ED Notes (Signed)
Delay in lab draw, pt enroute to [facility] 

## 2015-06-20 NOTE — ED Notes (Signed)
Pt now in CT

## 2015-06-20 NOTE — Discharge Instructions (Signed)
Chest Contusion A chest contusion is a deep bruise on your chest area. Contusions are the result of an injury that caused bleeding under the skin. A chest contusion may involve bruising of the skin, muscles, or ribs. The contusion may turn blue, purple, or yellow. Minor injuries will give you a painless contusion, but more severe contusions may stay painful and swollen for a few weeks. CAUSES  A contusion is usually caused by a blow, trauma, or direct force to an area of the body. SYMPTOMS   Swelling and redness of the injured area.  Discoloration of the injured area.  Tenderness and soreness of the injured area.  Pain. DIAGNOSIS  The diagnosis can be made by taking a history and performing a physical exam. An X-ray, CT scan, or MRI may be needed to determine if there were any associated injuries, such as broken bones (fractures) or internal injuries. TREATMENT  Often, the best treatment for a chest contusion is resting, icing, and applying cold compresses to the injured area. Deep breathing exercises may be recommended to reduce the risk of pneumonia. Over-the-counter medicines may also be recommended for pain control. HOME CARE INSTRUCTIONS   Put ice on the injured area.  Put ice in a plastic bag.  Place a towel between your skin and the bag.  Leave the ice on for 15-20 minutes, 03-04 times a day.  Only take over-the-counter or prescription medicines as directed by your caregiver. Your caregiver may recommend avoiding anti-inflammatory medicines (aspirin, ibuprofen, and naproxen) for 48 hours because these medicines may increase bruising.  Rest the injured area.  Perform deep-breathing exercises as directed by your caregiver.  Stop smoking if you smoke.  Do not lift objects over 5 pounds (2.3 kg) for 3 days or longer if recommended by your caregiver. SEEK IMMEDIATE MEDICAL CARE IF:   You have increased bruising or swelling.  You have pain that is getting worse.  You have  difficulty breathing.  You have dizziness, weakness, or fainting.  You have blood in your urine or stool.  You cough up or vomit blood.  Your swelling or pain is not relieved with medicines. MAKE SURE YOU:   Understand these instructions.  Will watch your condition.  Will get help right away if you are not doing well or get worse.   This information is not intended to replace advice given to you by your health care provider. Make sure you discuss any questions you have with your health care provider.   Document Released: 01/21/2001 Document Revised: 01/21/2012 Document Reviewed: 10/20/2011 Elsevier Interactive Patient Education 2016 [employer]  Facial Laceration  A facial laceration is a cut on the face. These injuries can be painful and cause bleeding. Lacerations usually heal quickly, but they need special care to reduce scarring. DIAGNOSIS  Your health care provider will take a medical history, ask for details about how the injury occurred, and examine the wound to determine how deep the cut is. TREATMENT  Some facial lacerations may not require closure. Others may not be able to be closed because of an increased risk of infection. The risk of infection and the chance for successful closure will depend on various factors, including the amount of time since the injury occurred. The wound may be cleaned to help prevent infection. If closure is appropriate, pain medicines may be given if needed. Your health care provider will use stitches (sutures), wound glue (adhesive), or skin adhesive strips to repair the laceration. These tools bring the skin  edges together to allow for faster healing and a better cosmetic outcome. If needed, you may also be given a tetanus shot. HOME CARE INSTRUCTIONS  Only take over-the-counter or prescription medicines as directed by your health care provider.  Follow your health care provider's instructions for wound care. These instructions will vary  depending on the technique used for closing the wound. For Sutures:  Keep the wound clean and dry.   If you were given a bandage (dressing), you should change it at least once a day. Also change the dressing if it becomes wet or dirty, or as directed by your health care provider.   Wash the wound with soap and water 2 times a day. Rinse the wound off with water to remove all soap. Pat the wound dry with a clean towel.   After cleaning, apply a thin layer of the antibiotic ointment recommended by your health care provider. This will help prevent infection and keep the dressing from sticking.   You may shower as usual after the first 24 hours. Do not soak the wound in water until the sutures are removed.   Get your sutures removed as directed by your health care provider. With facial lacerations, sutures should usually be taken out after 4-5 days to avoid stitch marks.   Wait a few days after your sutures are removed before applying any makeup. For Skin Adhesive Strips:  Keep the wound clean and dry.   Do not get the skin adhesive strips wet. You may bathe carefully, using caution to keep the wound dry.   If the wound gets wet, pat it dry with a clean towel.   Skin adhesive strips will fall off on their own. You may trim the strips as the wound heals. Do not remove skin adhesive strips that are still stuck to the wound. They will fall off in time.  For Wound Adhesive:  You may briefly wet your wound in the shower or bath. Do not soak or scrub the wound. Do not swim. Avoid periods of heavy sweating until the skin adhesive has fallen off on its own. After showering or bathing, gently pat the wound dry with a clean towel.   Do not apply liquid medicine, cream medicine, ointment medicine, or makeup to your wound while the skin adhesive is in place. This may loosen the film before your wound is healed.   If a dressing is placed over the wound, be careful not to apply tape directly  over the skin adhesive. This may cause the adhesive to be pulled off before the wound is healed.   Avoid prolonged exposure to sunlight or tanning lamps while the skin adhesive is in place.  The skin adhesive will usually remain in place for 5-10 days, then naturally fall off the skin. Do not pick at the adhesive film.  After Healing: Once the wound has healed, cover the wound with sunscreen during the day for 1 full year. This can help minimize scarring. Exposure to ultraviolet light in the first year will darken the scar. It can take 1-2 years for the scar to lose its redness and to heal completely.  SEEK MEDICAL CARE IF:  You have a fever. SEEK IMMEDIATE MEDICAL CARE IF:  You have redness, pain, or swelling around the wound.   You see ayellowish-white fluid (pus) coming from the wound.    This information is not intended to replace advice given to you by your health care provider. Make sure you discuss any questions  you have with your health care provider.   Document Released: 06/05/2004 Document Revised: 05/19/2014 Document Reviewed: 12/09/2012 Elsevier Interactive Patient Education 2016 [employer].  General Assault Assault includes any behavior or physical attack--whether it is on purpose or not--that results in injury to another person, damage to property, or both. This also includes assault that has not yet happened, but is planned to happen. Threats of assault may be physical, verbal, or written. They may be said or sent by:  Mail.  E-mail.  Text.  Social media.  Fax. The threats may be direct, implied, or understood. WHAT ARE THE DIFFERENT FORMS OF ASSAULT? Forms of assault include:  Physically assaulting a person. This includes physical threats to inflict physical harm as well as:  Slapping.  Hitting.  Poking.  Kicking.  Punching.  Pushing.  Sexually assaulting a person. Sexual assault is any sexual activity that a person is forced, threatened, or  coerced to participate in. It may or may not involve physical contact with the person who is assaulting you. You are sexually assaulted if you are forced to have sexual contact of any kind.  Damaging or destroying a person's assistive equipment, such as glasses, canes, or walkers.  Throwing or hitting objects.  Using or displaying a weapon to harm or threaten someone.  Using or displaying an object that appears to be a weapon in a threatening manner.  Using greater physical size or strength to intimidate someone.  Making intimidating or threatening gestures.  Bullying.  Hazing.  Using language that is intimidating, threatening, hostile, or abusive.  Stalking.  Restraining someone with force. WHAT SHOULD I DO IF I EXPERIENCE ASSAULT?  Report assaults, threats, and stalking to the police. Call your local emergency services (911 in the [place] if you are in immediate danger or you need medical help.  You can work with a Clinical research associate or an advocate to get legal protection against someone who has assaulted you or threatened you with assault. Protection includes restraining orders and private addresses. Crimes against you, such as assault, can also be prosecuted through the courts. Laws will vary depending on where you live.   This information is not intended to replace advice given to you by your health care provider. Make sure you discuss any questions you have with your health care provider.   Document Released: 04/28/2005 Document Revised: 05/19/2014 Document Reviewed: 01/13/2014 Elsevier Interactive Patient Education 2016 [employer]  Laceration Care, Adult A laceration is a cut that goes through all of the layers of the skin and into the tissue that is right under the skin. Some lacerations heal on their own. Others need to be closed with stitches (sutures), staples, skin adhesive strips, or skin glue. Proper laceration care minimizes the risk of infection and helps the laceration to  heal better. HOW TO CARE FOR YOUR LACERATION If sutures or staples were used:  Keep the wound clean and dry.  If you were given a bandage (dressing), you should change it at least one time per day or as told by your health care provider. You should also change it if it becomes wet or dirty.  Keep the wound completely dry for the first 24 hours or as told by your health care provider. After that time, you may shower or bathe. However, make sure that the wound is not soaked in water until after the sutures or staples have been removed.  Clean the wound one time each day or as told by your health care provider:  Wash the wound with soap and water.  Rinse the wound with water to remove all soap.  Pat the wound dry with a clean towel. Do not rub the wound.  After cleaning the wound, apply a thin layer of antibiotic ointmentas told by your health care provider. This will help to prevent infection and keep the dressing from sticking to the wound.  Have the sutures or staples removed as told by your health care provider. If skin adhesive strips were used:  Keep the wound clean and dry.  If you were given a bandage (dressing), you should change it at least one time per day or as told by your health care provider. You should also change it if it becomes dirty or wet.  Do not get the skin adhesive strips wet. You may shower or bathe, but be careful to keep the wound dry.  If the wound gets wet, pat it dry with a clean towel. Do not rub the wound.  Skin adhesive strips fall off on their own. You may trim the strips as the wound heals. Do not remove skin adhesive strips that are still stuck to the wound. They will fall off in time. If skin glue was used:  Try to keep the wound dry, but you may briefly wet it in the shower or bath. Do not soak the wound in water, such as by swimming.  After you have showered or bathed, gently pat the wound dry with a clean towel. Do not rub the wound.  Do  not do any activities that will make you sweat heavily until the skin glue has fallen off on its own.  Do not apply liquid, cream, or ointment medicine to the wound while the skin glue is in place. Using those may loosen the film before the wound has healed.  If you were given a bandage (dressing), you should change it at least one time per day or as told by your health care provider. You should also change it if it becomes dirty or wet.  If a dressing is placed over the wound, be careful not to apply tape directly over the skin glue. Doing that may cause the glue to be pulled off before the wound has healed.  Do not pick at the glue. The skin glue usually remains in place for 5-10 days, then it falls off of the skin. General Instructions  Take over-the-counter and prescription medicines only as told by your health care provider.  If you were prescribed an antibiotic medicine or ointment, take or apply it as told by your doctor. Do not stop using it even if your condition improves.  To help prevent scarring, make sure to cover your wound with sunscreen whenever you are outside after stitches are removed, after adhesive strips are removed, or when glue remains in place and the wound is healed. Make sure to wear a sunscreen of at least 30 SPF.  Do not scratch or pick at the wound.  Keep all follow-up visits as told by your health care provider. This is important.  Check your wound every day for signs of infection. Watch for:  Redness, swelling, or pain.  Fluid, blood, or pus.  Raise (elevate) the injured area above the level of your heart while you are sitting or lying down, if possible. SEEK MEDICAL CARE IF:  You received a tetanus shot and you have swelling, severe pain, redness, or bleeding at the injection site.  You have a fever.  A wound  that was closed breaks open.  You notice a bad smell coming from your wound or your dressing.  You notice something coming out of the  wound, such as wood or glass.  Your pain is not controlled with medicine.  You have increased redness, swelling, or pain at the site of your wound.  You have fluid, blood, or pus coming from your wound.  You notice a change in the color of your skin near your wound.  You need to change the dressing frequently due to fluid, blood, or pus draining from the wound.  You develop a new rash.  You develop numbness around the wound. SEEK IMMEDIATE MEDICAL CARE IF:  You develop severe swelling around the wound.  Your pain suddenly increases and is severe.  You develop painful lumps near the wound or on skin that is anywhere on your body.  You have a red streak going away from your wound.  The wound is on your hand or foot and you cannot properly move a finger or toe.  The wound is on your hand or foot and you notice that your fingers or toes look pale or bluish.   This information is not intended to replace advice given to you by your health care provider. Make sure you discuss any questions you have with your health care provider.   Document Released: 04/28/2005 Document Revised: 09/12/2014 Document Reviewed: 04/24/2014 Elsevier Interactive Patient Education [employer]
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
## 2015-06-20 NOTE — ED Notes (Signed)
Bed: WTR5 Expected date:  Expected time:  Means of arrival:  Comments: EMS 

## 2015-07-03 ENCOUNTER — Emergency Department (HOSPITAL_COMMUNITY)
Admission: EM | Admit: 2015-07-03 | Discharge: 2015-07-03 | Disposition: A | Discharge status: Home / Self Care | Payer: PRIVATE HEALTH INSURANCE | Attending: Emergency Medicine | Admitting: Emergency Medicine

## 2015-07-03 ENCOUNTER — Encounter (HOSPITAL_COMMUNITY): Payer: Self-pay | Admitting: Emergency Medicine

## 2015-07-03 DIAGNOSIS — Z79899 Other long term (current) drug therapy: Secondary | ICD-10-CM | POA: Diagnosis not present

## 2015-07-03 DIAGNOSIS — I10 Essential (primary) hypertension: Secondary | ICD-10-CM | POA: Insufficient documentation

## 2015-07-03 DIAGNOSIS — Z7982 Long term (current) use of aspirin: Secondary | ICD-10-CM | POA: Insufficient documentation

## 2015-07-03 DIAGNOSIS — M1990 Unspecified osteoarthritis, unspecified site: Secondary | ICD-10-CM | POA: Diagnosis not present

## 2015-07-03 DIAGNOSIS — R51 Headache: Secondary | ICD-10-CM | POA: Insufficient documentation

## 2015-07-03 DIAGNOSIS — Z8719 Personal history of other diseases of the digestive system: Secondary | ICD-10-CM | POA: Diagnosis not present

## 2015-07-03 DIAGNOSIS — F329 Major depressive disorder, single episode, unspecified: Secondary | ICD-10-CM | POA: Insufficient documentation

## 2015-07-03 DIAGNOSIS — G8929 Other chronic pain: Secondary | ICD-10-CM | POA: Diagnosis not present

## 2015-07-03 DIAGNOSIS — F17210 Nicotine dependence, cigarettes, uncomplicated: Secondary | ICD-10-CM | POA: Insufficient documentation

## 2015-07-03 DIAGNOSIS — Z4802 Encounter for removal of sutures: Secondary | ICD-10-CM | POA: Insufficient documentation

## 2015-07-03 NOTE — Discharge Instructions (Signed)

## 2015-07-03 NOTE — ED Notes (Signed)
Recently here and had staples placed, area appears healed. Edges well approximated, no drainage, redness or swelling.
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
## 2015-08-27 ENCOUNTER — Encounter: Payer: Self-pay | Admitting: *Deleted

## 2015-08-28 ENCOUNTER — Ambulatory Visit: Payer: Medicare Other | Admitting: Diagnostic Neuroimaging

## 2015-08-29 ENCOUNTER — Encounter: Payer: Self-pay | Admitting: Diagnostic Neuroimaging

## 2015-10-01 ENCOUNTER — Encounter: Payer: Self-pay | Admitting: Diagnostic Neuroimaging

## 2015-10-01 ENCOUNTER — Ambulatory Visit (INDEPENDENT_AMBULATORY_CARE_PROVIDER_SITE_OTHER): Payer: Medicare Other | Admitting: Diagnostic Neuroimaging

## 2015-10-01 VITALS — BP 134/90 | HR 82 | Ht 67.5 in | Wt 171.2 lb

## 2015-10-01 DIAGNOSIS — M5441 Lumbago with sciatica, right side: Secondary | ICD-10-CM

## 2015-10-01 DIAGNOSIS — M79602 Pain in left arm: Secondary | ICD-10-CM | POA: Diagnosis not present

## 2015-10-01 DIAGNOSIS — G8929 Other chronic pain: Secondary | ICD-10-CM | POA: Diagnosis not present

## 2015-10-01 DIAGNOSIS — M5442 Lumbago with sciatica, left side: Secondary | ICD-10-CM

## 2015-10-01 DIAGNOSIS — M79605 Pain in left leg: Secondary | ICD-10-CM

## 2015-10-01 NOTE — Patient Instructions (Signed)
Thank you for coming to see [place] at [facility]. I hope we have been able to provide you high quality care today.  You may receive a patient satisfaction survey over the next few weeks. We would appreciate your feedback and comments so that we may continue to improve ourselves and the health of our patients.  - I will check MRI lumbar spine   ~~~~~~~~~~~~~~~~~~~~~~~~~~~~~~~~~~~~~~~~~~~~~~~~~~~~~~~~~~~~~~~~~  DR. PENUMALLI'S GUIDE TO HAPPY AND HEALTHY LIVING These are some of my general health and wellness recommendations. Some of them may apply to you better than others. Please use common sense as you try these suggestions and feel free to ask me any questions.   ACTIVITY/FITNESS Mental, social, emotional and physical stimulation are very important for brain and body health. Try learning a new activity (arts, music, language, sports, games).  Keep moving your body to the best of your abilities. You can do this at home, inside or outside, the park, community center, gym or anywhere you like. Consider a physical therapist or personal trainer to get started. Consider the app Sworkit. Fitness trackers such as smart-watches, smart-phones or Fitbits can help as well.   NUTRITION Eat more plants: colorful vegetables, nuts, seeds and berries.  Eat less sugar, salt, preservatives and processed foods.  Avoid toxins such as cigarettes and alcohol.  Drink water when you are thirsty. Warm water with a slice of lemon is an excellent morning drink to start the day.  Consider these websites for more information The Nutrition Source (https://www.henry-hernandez.biz/) Precision Nutrition (WindowBlog.ch)   RELAXATION Consider practicing mindfulness meditation or other relaxation techniques such as deep breathing, prayer, yoga, tai chi, massage. See website mindful.org or the apps Headspace or Calm to help get started.   SLEEP Try to get  at least 7-8+ hours sleep per day. Regular exercise and reduced caffeine will help you sleep better. Practice good sleep hygeine techniques. See website sleep.org for more information.   PLANNING Prepare estate planning, living will, healthcare POA documents. Sometimes this is best planned with the help of an attorney. Theconversationproject.org and agingwithdignity.org are excellent resources.
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
## 2015-11-21 ENCOUNTER — Ambulatory Visit: Payer: Medicare Other | Admitting: Diagnostic Neuroimaging

## 2015-11-22 ENCOUNTER — Encounter: Payer: Self-pay | Admitting: Diagnostic Neuroimaging

## 2016-04-20 ENCOUNTER — Ambulatory Visit (HOSPITAL_COMMUNITY)
Admission: EM | Admit: 2016-04-20 | Discharge: 2016-04-20 | Disposition: A | Discharge status: Home / Self Care | Payer: Medicare Other | Attending: Emergency Medicine | Admitting: Emergency Medicine

## 2016-04-20 ENCOUNTER — Encounter (HOSPITAL_COMMUNITY): Payer: Self-pay | Admitting: Emergency Medicine

## 2016-04-20 DIAGNOSIS — S61211A Laceration without foreign body of left index finger without damage to nail, initial encounter: Secondary | ICD-10-CM | POA: Diagnosis not present

## 2016-04-20 MED ORDER — OXYCODONE HCL 5 MG PO TABS: 5.0000 mg | ORAL_TABLET | ORAL | 0 refills | Status: DC | PRN

## 2016-04-20 MED ORDER — TETANUS-DIPHTH-ACELL PERTUSSIS 5-2.5-18.5 LF-MCG/0.5 IM SUSP
INTRAMUSCULAR | Status: AC
  Filled 2016-04-20: qty 0.5

## 2016-04-20 MED ORDER — TETANUS-DIPHTH-ACELL PERTUSSIS 5-2.5-18.5 LF-MCG/0.5 IM SUSP
0.5000 mL | Freq: Once | INTRAMUSCULAR | Status: AC
  Administered 2016-04-20: 0.5 mL via INTRAMUSCULAR

## 2016-04-20 NOTE — Discharge Instructions (Signed)
We stitched up your finger. Keep it dry for the next 24 hours. After that wash it twice a day with soap and water. Do not soak it in water. The stitches should come out in 1 week. If signs of infection, please come back right away.
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
## 2016-05-02 ENCOUNTER — Ambulatory Visit (HOSPITAL_COMMUNITY)
Admission: EM | Admit: 2016-05-02 | Discharge: 2016-05-02 | Disposition: A | Discharge status: Home / Self Care | Payer: Medicare Other | Attending: Family Medicine | Admitting: Family Medicine

## 2016-05-02 ENCOUNTER — Encounter (HOSPITAL_COMMUNITY): Payer: Self-pay | Admitting: *Deleted

## 2016-05-02 DIAGNOSIS — T814XXA Infection following a procedure, initial encounter: Secondary | ICD-10-CM

## 2016-05-02 DIAGNOSIS — IMO0001 Reserved for inherently not codable concepts without codable children: Secondary | ICD-10-CM

## 2016-05-02 MED ORDER — DOXYCYCLINE HYCLATE 100 MG PO TABS: 100.0000 mg | ORAL_TABLET | Freq: Two times a day (BID) | ORAL | 0 refills | Status: DC

## 2016-05-02 MED ORDER — OXYCODONE HCL 5 MG PO TABS: 5.0000 mg | ORAL_TABLET | ORAL | 0 refills | Status: AC | PRN

## 2016-05-02 NOTE — Discharge Instructions (Signed)
Please soak the wound twice a day in warm soapy water. Cover the finger when Could be contaminated with dust or dirt or grease.
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
## 2016-05-02 NOTE — ED Triage Notes (Signed)
Pt is  Here  For  Suture  Removal   Sutures  Have  Been  In  For  12  Days

## 2016-07-25 ENCOUNTER — Emergency Department (HOSPITAL_COMMUNITY): Payer: Medicare Other

## 2016-07-25 ENCOUNTER — Emergency Department (HOSPITAL_COMMUNITY)
Admission: EM | Admit: 2016-07-25 | Discharge: 2016-07-25 | Disposition: A | Discharge status: Home / Self Care | Payer: Medicare Other | Attending: Emergency Medicine | Admitting: Emergency Medicine

## 2016-07-25 ENCOUNTER — Encounter (HOSPITAL_COMMUNITY): Payer: Self-pay

## 2016-07-25 DIAGNOSIS — R55 Syncope and collapse: Secondary | ICD-10-CM | POA: Insufficient documentation

## 2016-07-25 DIAGNOSIS — Z7982 Long term (current) use of aspirin: Secondary | ICD-10-CM | POA: Diagnosis not present

## 2016-07-25 DIAGNOSIS — Z79899 Other long term (current) drug therapy: Secondary | ICD-10-CM | POA: Insufficient documentation

## 2016-07-25 DIAGNOSIS — Z955 Presence of coronary angioplasty implant and graft: Secondary | ICD-10-CM | POA: Diagnosis not present

## 2016-07-25 DIAGNOSIS — F17210 Nicotine dependence, cigarettes, uncomplicated: Secondary | ICD-10-CM | POA: Insufficient documentation

## 2016-07-25 DIAGNOSIS — I10 Essential (primary) hypertension: Secondary | ICD-10-CM | POA: Insufficient documentation

## 2016-07-25 HISTORY — DX: Other psychoactive substance abuse, uncomplicated: F19.10

## 2016-07-25 HISTORY — DX: Acute myocardial infarction, unspecified: I21.9

## 2016-07-25 LAB — I-STAT CHEM 8, ED
BUN: 14 mg/dL (ref 6–20)
CREATININE: 1.3 mg/dL — AB (ref 0.61–1.24)
Calcium, Ion: 1.09 mmol/L — ABNORMAL LOW (ref 1.15–1.40)
Chloride: 99 mmol/L — ABNORMAL LOW (ref 101–111)
GLUCOSE: 115 mg/dL — AB (ref 65–99)
HCT: 42 % (ref 39.0–52.0)
HEMOGLOBIN: 14.3 g/dL (ref 13.0–17.0)
POTASSIUM: 3.5 mmol/L (ref 3.5–5.1)
Sodium: 136 mmol/L (ref 135–145)
TCO2: 26 mmol/L (ref 0–100)

## 2016-07-25 LAB — RAPID URINE DRUG SCREEN, HOSP PERFORMED
AMPHETAMINES: NOT DETECTED
BARBITURATES: NOT DETECTED
BENZODIAZEPINES: NOT DETECTED
Cocaine: NOT DETECTED
Opiates: NOT DETECTED
Tetrahydrocannabinol: NOT DETECTED

## 2016-07-25 LAB — ETHANOL: Alcohol, Ethyl (B): <5 mg/dL (ref <5)

## 2016-07-25 LAB — I-STAT TROPONIN, ED: TROPONIN I, POC: 0 ng/mL (ref 0.00–0.08)

## 2016-07-25 MED ORDER — SODIUM CHLORIDE 0.9 % IV BOLUS (SEPSIS)
500.0000 mL | Freq: Once | INTRAVENOUS | Status: AC
  Administered 2016-07-25: 500 mL via INTRAVENOUS

## 2016-07-25 MED ORDER — TRAMADOL HCL 50 MG PO TABS
50.0000 mg | ORAL_TABLET | Freq: Once | ORAL | Status: DC
  Filled 2016-07-25: qty 1

## 2016-07-25 MED ORDER — OXYCODONE-ACETAMINOPHEN 5-325 MG PO TABS
1.0000 | ORAL_TABLET | Freq: Once | ORAL | Status: DC
  Filled 2016-07-25: qty 1
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
## 2016-07-25 NOTE — ED Notes (Addendum)
Patient states "I can't take tramadol".  Patient refuses to take this pain medication.  States "forget it, just don't worry about it".  States "I should have just taken the percocet and then called a lawyer because that's on my allergy list".

## 2016-07-25 NOTE — ED Notes (Addendum)
ED Provider at bedside. UPDATED ON PT'S CURRENT STATUS. INFORMED EKG STORED HOWEVER NOT SENT. NO EKG ORDERED

## 2016-07-25 NOTE — ED Notes (Signed)
DELAY IN DISCHARGE- PATTI CHARGE RN AWARE

## 2016-07-25 NOTE — ED Notes (Signed)
BP while standing 138/69

## 2016-07-25 NOTE — ED Notes (Addendum)
PT DENIES DIZZINESS. PT CONTINUES WITH C/O OF CHEST PRESSURE AND BACK PAIN THAT HE STATES IS CHRONIC. FAMILY PRESENT

## 2016-07-25 NOTE — ED Notes (Signed)
Bed: [id number] Expected date: 07/25/16 Expected time:  Means of arrival:  Comments: Syncope [age] yr. old

## 2016-07-25 NOTE — ED Triage Notes (Addendum)
Per [facility]- Witnessed syncopal episode from standing position falling backwards ? Hitting tile flooring. No trauma noted. [facility] cleared. Denies neck and new back pain. PTAR states pain was coming in last night for chronic back pain- sciatica. PTAR states pt was sitting in a chair and witnessed syncopal episode during assessment. Pt eased to ground. Episode lasting 1 minute. PUPILS constricted. NARCAN 1mg  nasal given. Immediate recovery.  [facility]- bolus 250cc NS given on route. Pt at present neuro intact listening to music on headphones. STROKE NEGATIVE. EKG-NSR

## 2016-07-25 NOTE — ED Notes (Signed)
Patient transported to X-ray 

## 2016-07-25 NOTE — ED Notes (Addendum)
PT IS NOT CURRENTLY UNDER A PAIN MANAGEMENT CONTRACT YET IS TRYING TO TO GET INTO A PAIN MANAGEMENT CLINIC. [facility]. (CHRONIC BACK PAIN)

## 2016-08-22 ENCOUNTER — Other Ambulatory Visit: Payer: Self-pay | Admitting: Gastroenterology

## 2016-08-22 DIAGNOSIS — R1310 Dysphagia, unspecified: Secondary | ICD-10-CM

## 2016-08-22 DIAGNOSIS — R1319 Other dysphagia: Secondary | ICD-10-CM

## 2016-08-27 ENCOUNTER — Ambulatory Visit
Admission: RE | Admit: 2016-08-27 | Discharge: 2016-08-27 | Disposition: A | Discharge status: Home / Self Care | Payer: Medicare Other | Source: Ambulatory Visit | Attending: Gastroenterology | Admitting: Gastroenterology

## 2016-08-27 DIAGNOSIS — R1310 Dysphagia, unspecified: Secondary | ICD-10-CM

## 2016-08-27 DIAGNOSIS — R1319 Other dysphagia: Secondary | ICD-10-CM

## 2017-01-07 ENCOUNTER — Encounter (HOSPITAL_COMMUNITY): Payer: Self-pay | Admitting: Emergency Medicine

## 2017-01-07 ENCOUNTER — Emergency Department (HOSPITAL_COMMUNITY)
Admission: EM | Admit: 2017-01-07 | Discharge: 2017-01-07 | Disposition: A | Discharge status: Home / Self Care | Payer: Medicare Other | Attending: Emergency Medicine | Admitting: Emergency Medicine

## 2017-01-07 DIAGNOSIS — Z7982 Long term (current) use of aspirin: Secondary | ICD-10-CM | POA: Insufficient documentation

## 2017-01-07 DIAGNOSIS — L255 Unspecified contact dermatitis due to plants, except food: Secondary | ICD-10-CM | POA: Insufficient documentation

## 2017-01-07 DIAGNOSIS — I252 Old myocardial infarction: Secondary | ICD-10-CM | POA: Insufficient documentation

## 2017-01-07 DIAGNOSIS — I10 Essential (primary) hypertension: Secondary | ICD-10-CM | POA: Insufficient documentation

## 2017-01-07 DIAGNOSIS — Z8673 Personal history of transient ischemic attack (TIA), and cerebral infarction without residual deficits: Secondary | ICD-10-CM | POA: Insufficient documentation

## 2017-01-07 DIAGNOSIS — Z79899 Other long term (current) drug therapy: Secondary | ICD-10-CM | POA: Diagnosis not present

## 2017-01-07 DIAGNOSIS — F17210 Nicotine dependence, cigarettes, uncomplicated: Secondary | ICD-10-CM | POA: Diagnosis not present

## 2017-01-07 DIAGNOSIS — R21 Rash and other nonspecific skin eruption: Secondary | ICD-10-CM | POA: Diagnosis present

## 2017-01-07 MED ORDER — TRIAMCINOLONE ACETONIDE 0.1 % EX CREA: 1.0000 "application " | TOPICAL_CREAM | Freq: Two times a day (BID) | CUTANEOUS | 1 refills | Status: AC

## 2017-01-07 MED ORDER — HYDROXYZINE HCL 10 MG PO TABS: 10.0000 mg | ORAL_TABLET | Freq: Four times a day (QID) | ORAL | 0 refills | Status: AC | PRN
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
## 2017-01-07 NOTE — ED Notes (Addendum)
Pt left without vitals being taken, was given d/c packet and prescriptions. Verbalized understanding of instructions

## 2017-01-07 NOTE — ED Triage Notes (Signed)
Patient arrived with EMS from home reports itchy skin rashes at forearms , chest and abdomen onset yesterday while working at his yard , seen by his PCP prescribed with medicated cream but did not fill prescription .

## 2017-01-08 ENCOUNTER — Emergency Department (HOSPITAL_COMMUNITY)
Admission: EM | Admit: 2017-01-08 | Discharge: 2017-01-08 | Disposition: A | Discharge status: Home / Self Care | Payer: Medicare Other | Attending: Emergency Medicine | Admitting: Emergency Medicine

## 2017-01-08 ENCOUNTER — Encounter (HOSPITAL_COMMUNITY): Payer: Self-pay | Admitting: Emergency Medicine

## 2017-01-08 DIAGNOSIS — Z7982 Long term (current) use of aspirin: Secondary | ICD-10-CM | POA: Insufficient documentation

## 2017-01-08 DIAGNOSIS — L237 Allergic contact dermatitis due to plants, except food: Secondary | ICD-10-CM | POA: Diagnosis not present

## 2017-01-08 DIAGNOSIS — I10 Essential (primary) hypertension: Secondary | ICD-10-CM | POA: Insufficient documentation

## 2017-01-08 DIAGNOSIS — F17210 Nicotine dependence, cigarettes, uncomplicated: Secondary | ICD-10-CM | POA: Diagnosis not present

## 2017-01-08 DIAGNOSIS — Z79899 Other long term (current) drug therapy: Secondary | ICD-10-CM | POA: Insufficient documentation

## 2017-01-08 DIAGNOSIS — R21 Rash and other nonspecific skin eruption: Secondary | ICD-10-CM | POA: Diagnosis present

## 2017-01-08 MED ORDER — BETAMETHASONE SOD PHOS & ACET 6 (3-3) MG/ML IJ SUSP
6.0000 mg | Freq: Once | INTRAMUSCULAR | Status: AC
  Administered 2017-01-08: 6 mg via INTRAMUSCULAR
  Filled 2017-01-08: qty 1

## 2017-01-08 MED ORDER — PREDNISONE 10 MG PO TABS: 20.0000 mg | ORAL_TABLET | Freq: Two times a day (BID) | ORAL | 0 refills | Status: AC
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
## 2017-01-08 NOTE — ED Triage Notes (Signed)
Patient has rash on arms, torso and bilat legs that started 3 days ago and continuing to spread and burn.  Patient reports that [facility]CP and [facility] the past two days and given prescriptions but medications arent helping.  Patient here visiting family and decided to be checked out while here.

## 2017-01-08 NOTE — Discharge Instructions (Signed)
We have given you an injection for your contact dermatitis. You will also have a prescription that you will start taking tomorrow. Continue to take the atarax you were prescribed last night. Follow up with your primary care doctor. Return here as needed.
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
## 2017-08-07 ENCOUNTER — Emergency Department (HOSPITAL_COMMUNITY): Payer: Medicare Other

## 2017-08-07 ENCOUNTER — Other Ambulatory Visit: Payer: Self-pay

## 2017-08-07 ENCOUNTER — Encounter (HOSPITAL_COMMUNITY): Payer: Self-pay

## 2017-08-07 ENCOUNTER — Emergency Department (HOSPITAL_COMMUNITY)
Admission: EM | Admit: 2017-08-07 | Discharge: 2017-08-07 | Disposition: A | Discharge status: Home / Self Care | Payer: Medicare Other | Attending: Emergency Medicine | Admitting: Emergency Medicine

## 2017-08-07 DIAGNOSIS — F329 Major depressive disorder, single episode, unspecified: Secondary | ICD-10-CM | POA: Insufficient documentation

## 2017-08-07 DIAGNOSIS — R0789 Other chest pain: Secondary | ICD-10-CM | POA: Insufficient documentation

## 2017-08-07 DIAGNOSIS — Z955 Presence of coronary angioplasty implant and graft: Secondary | ICD-10-CM | POA: Diagnosis not present

## 2017-08-07 DIAGNOSIS — I10 Essential (primary) hypertension: Secondary | ICD-10-CM | POA: Insufficient documentation

## 2017-08-07 DIAGNOSIS — I252 Old myocardial infarction: Secondary | ICD-10-CM | POA: Insufficient documentation

## 2017-08-07 DIAGNOSIS — J0100 Acute maxillary sinusitis, unspecified: Secondary | ICD-10-CM | POA: Insufficient documentation

## 2017-08-07 DIAGNOSIS — R197 Diarrhea, unspecified: Secondary | ICD-10-CM | POA: Diagnosis not present

## 2017-08-07 DIAGNOSIS — Z79899 Other long term (current) drug therapy: Secondary | ICD-10-CM | POA: Insufficient documentation

## 2017-08-07 DIAGNOSIS — F17210 Nicotine dependence, cigarettes, uncomplicated: Secondary | ICD-10-CM | POA: Insufficient documentation

## 2017-08-07 DIAGNOSIS — Z7982 Long term (current) use of aspirin: Secondary | ICD-10-CM | POA: Insufficient documentation

## 2017-08-07 DIAGNOSIS — F1410 Cocaine abuse, uncomplicated: Secondary | ICD-10-CM | POA: Diagnosis not present

## 2017-08-07 DIAGNOSIS — R0981 Nasal congestion: Secondary | ICD-10-CM | POA: Diagnosis present

## 2017-08-07 DIAGNOSIS — R112 Nausea with vomiting, unspecified: Secondary | ICD-10-CM | POA: Diagnosis not present

## 2017-08-07 LAB — URINALYSIS, ROUTINE W REFLEX MICROSCOPIC
Bilirubin Urine: NEGATIVE
Glucose, UA: NEGATIVE mg/dL
Hgb urine dipstick: NEGATIVE
Ketones, ur: NEGATIVE mg/dL
Leukocytes, UA: NEGATIVE
Nitrite: NEGATIVE
Protein, ur: NEGATIVE mg/dL
Specific Gravity, Urine: 1.011 (ref 1.005–1.030)
pH: 7 (ref 5.0–8.0)

## 2017-08-07 LAB — COMPREHENSIVE METABOLIC PANEL
ALT: 17 U/L (ref 17–63)
AST: 19 U/L (ref 15–41)
Albumin: 3.5 g/dL (ref 3.5–5.0)
Alkaline Phosphatase: 57 U/L (ref 38–126)
Anion gap: 7 (ref 5–15)
BUN: 15 mg/dL (ref 6–20)
CO2: 24 mmol/L (ref 22–32)
Calcium: 8.6 mg/dL — ABNORMAL LOW (ref 8.9–10.3)
Chloride: 108 mmol/L (ref 101–111)
Creatinine, Ser: 1.43 mg/dL — ABNORMAL HIGH (ref 0.61–1.24)
GFR calc Af Amer: >60 mL/min (ref >60)
GFR calc non Af Amer: 53 mL/min — ABNORMAL LOW (ref >60)
Glucose, Bld: 85 mg/dL (ref 65–99)
Potassium: 3.9 mmol/L (ref 3.5–5.1)
Sodium: 139 mmol/L (ref 135–145)
Total Bilirubin: 1.2 mg/dL (ref 0.3–1.2)
Total Protein: 6.4 g/dL — ABNORMAL LOW (ref 6.5–8.1)

## 2017-08-07 LAB — CBC WITH DIFFERENTIAL/PLATELET
Basophils Absolute: 0 10*3/uL (ref 0.0–0.1)
Basophils Relative: 0 %
Eosinophils Absolute: 0.1 10*3/uL (ref 0.0–0.7)
Eosinophils Relative: 1 %
HCT: 44 % (ref 39.0–52.0)
Hemoglobin: 14.9 g/dL (ref 13.0–17.0)
Lymphocytes Relative: 36 %
Lymphs Abs: 2.4 10*3/uL (ref 0.7–4.0)
MCH: 29.7 pg (ref 26.0–34.0)
MCHC: 33.9 g/dL (ref 30.0–36.0)
MCV: 87.6 fL (ref 78.0–100.0)
Monocytes Absolute: 0.6 10*3/uL (ref 0.1–1.0)
Monocytes Relative: 9 %
Neutro Abs: 3.6 10*3/uL (ref 1.7–7.7)
Neutrophils Relative %: 54 %
Platelets: 203 10*3/uL (ref 150–400)
RBC: 5.02 MIL/uL (ref 4.22–5.81)
RDW: 14.9 % (ref 11.5–15.5)
WBC: 6.8 10*3/uL (ref 4.0–10.5)

## 2017-08-07 LAB — LIPASE, BLOOD: Lipase: 32 U/L (ref 11–51)

## 2017-08-07 MED ORDER — DOXYCYCLINE HYCLATE 100 MG PO CAPS: 100.0000 mg | ORAL_CAPSULE | Freq: Two times a day (BID) | ORAL | 0 refills | Status: AC

## 2017-08-07 MED ORDER — SODIUM CHLORIDE 0.9 % IV BOLUS
1000.0000 mL | Freq: Once | INTRAVENOUS | Status: AC
  Administered 2017-08-07: 1000 mL via INTRAVENOUS

## 2017-08-07 MED ORDER — ONDANSETRON HCL 4 MG/2ML IJ SOLN
4.0000 mg | Freq: Once | INTRAMUSCULAR | Status: AC
  Administered 2017-08-07: 4 mg via INTRAVENOUS
  Filled 2017-08-07: qty 2

## 2017-08-07 MED ORDER — FLUTICASONE PROPIONATE 50 MCG/ACT NA SUSP: 2.0000 | Freq: Every day | NASAL | 0 refills | Status: AC

## 2017-08-07 MED ORDER — DOXYCYCLINE HYCLATE 100 MG PO TABS
100.0000 mg | ORAL_TABLET | Freq: Once | ORAL | Status: AC
  Administered 2017-08-07: 100 mg via ORAL
  Filled 2017-08-07: qty 1

## 2017-08-07 NOTE — Discharge Instructions (Addendum)
You are medically cleared to go to [facility].   Please take all of your antibiotics until finished!   You may develop abdominal discomfort or diarrhea from the antibiotic.  You may help offset this with probiotics which you can buy or get in yogurt. Do not eat  or take the probiotics until 2 hours after your antibiotic.   Continue to stay well-hydrated. Gargle warm salt water and spit it out for sore throat. May also use cough drops, warm teas, etc. Take flonase to decrease nasal congestion. Alternate 600 mg of ibuprofen and [phone] mg of Tylenol every 3 hours as needed for pain. Do not exceed 4000 mg of Tylenol daily.   Followup with your primary care doctor in 5-7 days for recheck of ongoing symptoms and recheck of your kidney function. Return to emergency department for emergent changing or worsening of symptoms such as throat tightness, facial swelling, fever not controlled by ibuprofen or Tylenol,difficulty breathing, or chest pain.

## 2017-08-07 NOTE — ED Triage Notes (Signed)
Per EMS, pt. From home with complaint of SOB with congestion x 2 days now. Pt. Also complaint of generalized body aches. Alert and oriented x 4. Denied chest  Pain . Denied fever.

## 2017-08-07 NOTE — ED Notes (Signed)
Pt ambulated to bathroom with no assistance with no SOB or desaturation. 97% on RA. Patient eating two sandwiches and gingerale without N/V

## 2017-08-07 NOTE — ED Notes (Signed)
Bed: JX91 Expected date:  Expected time:  Means of arrival:  Comments: 35 M shortness of breath
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
## 2017-08-30 ENCOUNTER — Ambulatory Visit (HOSPITAL_COMMUNITY)
Admission: EM | Admit: 2017-08-30 | Discharge: 2017-08-30 | Disposition: A | Discharge status: Home / Self Care | Payer: Medicare Other | Attending: Internal Medicine | Admitting: Internal Medicine

## 2017-08-30 ENCOUNTER — Encounter (HOSPITAL_COMMUNITY): Payer: Self-pay | Admitting: Emergency Medicine

## 2017-08-30 ENCOUNTER — Ambulatory Visit (INDEPENDENT_AMBULATORY_CARE_PROVIDER_SITE_OTHER): Payer: Medicare Other

## 2017-08-30 DIAGNOSIS — M7712 Lateral epicondylitis, left elbow: Secondary | ICD-10-CM

## 2017-08-30 DIAGNOSIS — Z9889 Other specified postprocedural states: Secondary | ICD-10-CM

## 2017-08-30 DIAGNOSIS — M25522 Pain in left elbow: Secondary | ICD-10-CM
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
## 2018-01-31 ENCOUNTER — Encounter (HOSPITAL_COMMUNITY): Payer: Self-pay | Admitting: Obstetrics and Gynecology

## 2018-01-31 ENCOUNTER — Emergency Department (HOSPITAL_COMMUNITY)
Admission: EM | Admit: 2018-01-31 | Discharge: 2018-01-31 | Disposition: A | Discharge status: Home / Self Care | Payer: No Typology Code available for payment source | Attending: Emergency Medicine | Admitting: Emergency Medicine

## 2018-01-31 ENCOUNTER — Other Ambulatory Visit: Payer: Self-pay

## 2018-01-31 ENCOUNTER — Emergency Department (HOSPITAL_COMMUNITY): Payer: No Typology Code available for payment source

## 2018-01-31 DIAGNOSIS — S0240EA Zygomatic fracture, right side, initial encounter for closed fracture: Secondary | ICD-10-CM | POA: Diagnosis not present

## 2018-01-31 DIAGNOSIS — Y92410 Unspecified street and highway as the place of occurrence of the external cause: Secondary | ICD-10-CM | POA: Diagnosis not present

## 2018-01-31 DIAGNOSIS — S022XXA Fracture of nasal bones, initial encounter for closed fracture: Secondary | ICD-10-CM | POA: Diagnosis not present

## 2018-01-31 DIAGNOSIS — I10 Essential (primary) hypertension: Secondary | ICD-10-CM | POA: Insufficient documentation

## 2018-01-31 DIAGNOSIS — Y999 Unspecified external cause status: Secondary | ICD-10-CM | POA: Insufficient documentation

## 2018-01-31 DIAGNOSIS — Z955 Presence of coronary angioplasty implant and graft: Secondary | ICD-10-CM | POA: Diagnosis not present

## 2018-01-31 DIAGNOSIS — H532 Diplopia: Secondary | ICD-10-CM | POA: Diagnosis not present

## 2018-01-31 DIAGNOSIS — S0292XA Unspecified fracture of facial bones, initial encounter for closed fracture: Secondary | ICD-10-CM

## 2018-01-31 DIAGNOSIS — S0231XA Fracture of orbital floor, right side, initial encounter for closed fracture: Secondary | ICD-10-CM | POA: Diagnosis not present

## 2018-01-31 DIAGNOSIS — Y9389 Activity, other specified: Secondary | ICD-10-CM | POA: Insufficient documentation

## 2018-01-31 DIAGNOSIS — Z7982 Long term (current) use of aspirin: Secondary | ICD-10-CM | POA: Insufficient documentation

## 2018-01-31 DIAGNOSIS — F17210 Nicotine dependence, cigarettes, uncomplicated: Secondary | ICD-10-CM | POA: Diagnosis not present

## 2018-01-31 DIAGNOSIS — S0240CA Maxillary fracture, right side, initial encounter for closed fracture: Secondary | ICD-10-CM | POA: Insufficient documentation

## 2018-01-31 DIAGNOSIS — Z79899 Other long term (current) drug therapy: Secondary | ICD-10-CM | POA: Insufficient documentation

## 2018-01-31 DIAGNOSIS — S0993XA Unspecified injury of face, initial encounter: Secondary | ICD-10-CM | POA: Diagnosis present

## 2018-01-31 MED ORDER — FLUORESCEIN SODIUM 1 MG OP STRP
2.0000 | ORAL_STRIP | Freq: Once | OPHTHALMIC | Status: AC
  Administered 2018-01-31: 2 via OPHTHALMIC
  Filled 2018-01-31: qty 2

## 2018-01-31 MED ORDER — DOXYCYCLINE HYCLATE 100 MG PO CAPS: 200.0000 mg | ORAL_CAPSULE | Freq: Every day | ORAL | 0 refills | Status: AC

## 2018-01-31 MED ORDER — TETRACAINE HCL 0.5 % OP SOLN
1.0000 [drp] | Freq: Once | OPHTHALMIC | Status: AC
  Administered 2018-01-31: 1 [drp] via OPHTHALMIC
  Filled 2018-01-31: qty 4

## 2018-01-31 MED ORDER — TRAMADOL HCL 50 MG PO TABS: 50.0000 mg | ORAL_TABLET | Freq: Four times a day (QID) | ORAL | 0 refills | Status: AC | PRN
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
## 2018-02-02 ENCOUNTER — Encounter (INDEPENDENT_AMBULATORY_CARE_PROVIDER_SITE_OTHER): Payer: Self-pay | Admitting: Ophthalmology

## 2018-02-03 ENCOUNTER — Encounter (INDEPENDENT_AMBULATORY_CARE_PROVIDER_SITE_OTHER): Payer: Medicare Other | Admitting: Ophthalmology

## 2018-02-04 ENCOUNTER — Other Ambulatory Visit: Payer: Self-pay | Admitting: Otolaryngology

## 2018-02-04 ENCOUNTER — Encounter (HOSPITAL_COMMUNITY): Payer: Self-pay | Admitting: *Deleted

## 2018-02-04 ENCOUNTER — Telehealth (INDEPENDENT_AMBULATORY_CARE_PROVIDER_SITE_OTHER): Payer: Self-pay

## 2018-02-04 ENCOUNTER — Other Ambulatory Visit: Payer: Self-pay

## 2018-02-04 NOTE — Progress Notes (Addendum)
Mr Sites denies chest pain or shortness of breath.  Patient reports that he is out of all of his medications except Oxycodone.  "I need a new prescription. I see my PC every month, he is with [facility]."  Mr Mejorado reports that blood pressure has been normal, "it was 130/ 70 at Dr Jenne Pane office today. I checked records from Dr Jenne Pane office, blood pressure was 138/81. Patient reports that he does not see a cardiologist.  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  Patient had 2 coronary stents placed, "I was on blood thinners years ago."  I will request records from [facility].

## 2018-02-04 NOTE — Anesthesia Preprocedure Evaluation (Addendum)
Anesthesia Evaluation  Patient identified by MRN, date of birth, ID band Patient awake    Reviewed: Allergy & Precautions, NPO status , Patient's Chart, lab work & pertinent test results  Airway Mallampati: I  TM Distance: >3 FB Neck ROM: Full    Dental no notable dental hx. (+) Edentulous Upper, Edentulous Lower   Pulmonary COPD, Current Smoker,    Pulmonary exam normal breath sounds clear to auscultation       Cardiovascular Exercise Tolerance: Good hypertension, Pt. on medications + CAD, + Past MI and + Peripheral Vascular Disease  Normal cardiovascular exam Rhythm:Regular Rate:Normal     Neuro/Psych  Headaches, Seizures -,  negative psych ROS   GI/Hepatic PUD, GERD  ,(+)     substance abuse (last cocaine use Monday)  cocaine use,   Endo/Other    Renal/GU      Musculoskeletal  (+) Arthritis ,   Abdominal   Peds  Hematology   Anesthesia Other Findings   Reproductive/Obstetrics                            Lab Results  Component Value Date   WBC 6.8 08/07/2017   HGB 14.9 08/07/2017   HCT 44.0 08/07/2017   MCV 87.6 08/07/2017   PLT 203 08/07/2017   Lab Results  Component Value Date   CREATININE 1.43 (H) 08/07/2017   BUN 15 08/07/2017   NA 139 08/07/2017   K 3.9 08/07/2017   CL 108 08/07/2017   CO2 24 08/07/2017    Anesthesia Physical Anesthesia Plan  ASA: III  Anesthesia Plan: General   Post-op Pain Management:    Induction: Intravenous  PONV Risk Score and Plan: 2 and Treatment may vary due to age or medical condition, Ondansetron and Dexamethasone  Airway Management Planned: Oral ETT  Additional Equipment:   Intra-op Plan:   Post-operative Plan: Extubation in OR  Informed Consent: I have reviewed the patients History and Physical, chart, labs and discussed the procedure including the risks, benefits and alternatives for the proposed anesthesia with the  patient or authorized representative who has indicated his/her understanding and acceptance.   Dental advisory given  Plan Discussed with:   Anesthesia Plan Comments:         Anesthesia Quick Evaluation

## 2018-02-05 ENCOUNTER — Encounter (HOSPITAL_BASED_OUTPATIENT_CLINIC_OR_DEPARTMENT_OTHER): Payer: Self-pay | Admitting: *Deleted

## 2018-02-05 ENCOUNTER — Telehealth (INDEPENDENT_AMBULATORY_CARE_PROVIDER_SITE_OTHER): Payer: Self-pay

## 2018-02-05 ENCOUNTER — Other Ambulatory Visit: Payer: Self-pay

## 2018-02-05 ENCOUNTER — Ambulatory Visit (HOSPITAL_COMMUNITY): Admission: RE | Admit: 2018-02-05 | Payer: Medicare Other | Source: Ambulatory Visit | Admitting: Otolaryngology

## 2018-02-05 HISTORY — DX: Atherosclerotic heart disease of native coronary artery without angina pectoris: I25.10

## 2018-02-05 HISTORY — DX: Unspecified convulsions: R56.9

## 2018-02-05 HISTORY — DX: Chronic obstructive pulmonary disease, unspecified: J44.9

## 2018-02-05 HISTORY — DX: Gastric ulcer, unspecified as acute or chronic, without hemorrhage or perforation: K25.9

## 2018-02-05 SURGERY — OPEN REDUCTION INTERNAL FIXATION (ORIF) ORBITAL FRACTURE
Anesthesia: General | Laterality: Right

## 2018-02-05 NOTE — Progress Notes (Signed)
Pt rescheduled for [facility] surgery with Dr Redmond Baseman on 02/10/18.  Chart reviewed with Dr. Johnette Abraham FItzgerald MDA. Since pt has h/o of MI with no cards f/u and multiple social issues, length of surgery and non compliance with all home meds for over one month, pt needs would be better met at the main OR. Dr Ola Spurr also would recommend a PAT appt for Tox screen and BP check. Linda at Dr Redmond Baseman office notified of above.

## 2018-02-05 NOTE — Telephone Encounter (Signed)
Pt called on 09.25.19 at 1pm stating he would not be at his appointment on 09.25.19 at 1:30pm, stated he would like to reschedule but didn't know when he could come in; Called pt back on 09.27.19 at 8:25am and asked him when he would be able to come in for his ED follow up, pt stated that he come in on Tuesday October 1st; Appointment was made for Tuesday October 1st at 10:00, explained to pt it was imperative he be at his appointment due to two previous no shows this week, pt expressed understanding and assured me he would make his appointment;   Virgilio Belling, COA

## 2018-02-05 NOTE — Progress Notes (Signed)
Pt contacted this morning, pt stated he ate a sandwich and drank koolaid this morning around 0300. Pt stated he attempted to call Dr. Jenne Pane office and was unable to get anyone. He figured his surgery would be cancelled so he continued to eat breakfast as has been within the last hour. Per Dr. Richardson Landry, pt does not need to show up for surgery. OR Desk and Dr. Jenne Pane made aware of situation.   Viviano Simas, RN

## 2018-02-09 ENCOUNTER — Encounter (HOSPITAL_COMMUNITY): Payer: Self-pay | Admitting: *Deleted

## 2018-02-09 ENCOUNTER — Encounter (INDEPENDENT_AMBULATORY_CARE_PROVIDER_SITE_OTHER): Payer: Medicare Other | Admitting: Ophthalmology
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
## 2018-02-10 ENCOUNTER — Encounter (HOSPITAL_COMMUNITY)
Admission: RE | Disposition: A | Discharge status: Home / Self Care | Payer: Self-pay | Source: Ambulatory Visit | Attending: Otolaryngology

## 2018-02-10 ENCOUNTER — Ambulatory Visit (HOSPITAL_COMMUNITY): Payer: No Typology Code available for payment source | Admitting: Anesthesiology

## 2018-02-10 ENCOUNTER — Observation Stay (HOSPITAL_COMMUNITY)
Admission: RE | Admit: 2018-02-10 | Discharge: 2018-02-12 | Disposition: A | Discharge status: Home / Self Care | Payer: No Typology Code available for payment source | Source: Ambulatory Visit | Attending: Otolaryngology | Admitting: Otolaryngology

## 2018-02-10 DIAGNOSIS — J449 Chronic obstructive pulmonary disease, unspecified: Secondary | ICD-10-CM | POA: Diagnosis not present

## 2018-02-10 DIAGNOSIS — Z7982 Long term (current) use of aspirin: Secondary | ICD-10-CM | POA: Diagnosis not present

## 2018-02-10 DIAGNOSIS — F329 Major depressive disorder, single episode, unspecified: Secondary | ICD-10-CM | POA: Insufficient documentation

## 2018-02-10 DIAGNOSIS — F17210 Nicotine dependence, cigarettes, uncomplicated: Secondary | ICD-10-CM | POA: Diagnosis not present

## 2018-02-10 DIAGNOSIS — S02401A Maxillary fracture, unspecified, initial encounter for closed fracture: Secondary | ICD-10-CM | POA: Diagnosis present

## 2018-02-10 DIAGNOSIS — S0240EA Zygomatic fracture, right side, initial encounter for closed fracture: Principal | ICD-10-CM | POA: Insufficient documentation

## 2018-02-10 DIAGNOSIS — I2510 Atherosclerotic heart disease of native coronary artery without angina pectoris: Secondary | ICD-10-CM | POA: Insufficient documentation

## 2018-02-10 DIAGNOSIS — I252 Old myocardial infarction: Secondary | ICD-10-CM | POA: Insufficient documentation

## 2018-02-10 DIAGNOSIS — I10 Essential (primary) hypertension: Secondary | ICD-10-CM | POA: Insufficient documentation

## 2018-02-10 DIAGNOSIS — Z79899 Other long term (current) drug therapy: Secondary | ICD-10-CM | POA: Diagnosis not present

## 2018-02-10 DIAGNOSIS — S022XXA Fracture of nasal bones, initial encounter for closed fracture: Secondary | ICD-10-CM | POA: Diagnosis not present

## 2018-02-10 HISTORY — PX: ORIF ORBITAL FRACTURE: SHX5312

## 2018-02-10 HISTORY — PX: CLOSED REDUCTION NASAL FRACTURE: SHX5365

## 2018-02-10 LAB — BASIC METABOLIC PANEL
ANION GAP: 5 (ref 5–15)
BUN: 7 mg/dL (ref 6–20)
CHLORIDE: 109 mmol/L (ref 98–111)
CO2: 25 mmol/L (ref 22–32)
Calcium: 8.8 mg/dL — ABNORMAL LOW (ref 8.9–10.3)
Creatinine, Ser: 1.19 mg/dL (ref 0.61–1.24)
GFR calc Af Amer: >60 mL/min (ref >60)
Glucose, Bld: 95 mg/dL (ref 70–99)
POTASSIUM: 3.9 mmol/L (ref 3.5–5.1)
SODIUM: 139 mmol/L (ref 135–145)

## 2018-02-10 LAB — CBC
HEMATOCRIT: 47.6 % (ref 39.0–52.0)
HEMOGLOBIN: 15.2 g/dL (ref 13.0–17.0)
MCH: 28.6 pg (ref 26.0–34.0)
MCHC: 31.9 g/dL (ref 30.0–36.0)
MCV: 89.5 fL (ref 78.0–100.0)
Platelets: 258 10*3/uL (ref 150–400)
RBC: 5.32 MIL/uL (ref 4.22–5.81)
RDW: 14.5 % (ref 11.5–15.5)
WBC: 5 10*3/uL (ref 4.0–10.5)

## 2018-02-10 SURGERY — OPEN REDUCTION INTERNAL FIXATION (ORIF) ORBITAL FRACTURE
Anesthesia: General | Site: Nose | Laterality: Right

## 2018-02-10 MED ORDER — PHENYLEPHRINE 40 MCG/ML (10ML) SYRINGE FOR IV PUSH (FOR BLOOD PRESSURE SUPPORT)
PREFILLED_SYRINGE | INTRAVENOUS | Status: DC | PRN
  Administered 2018-02-10: 80 ug via INTRAVENOUS
  Administered 2018-02-10: 120 ug via INTRAVENOUS

## 2018-02-10 MED ORDER — ROCURONIUM BROMIDE 50 MG/5ML IV SOSY
PREFILLED_SYRINGE | INTRAVENOUS | Status: AC
  Filled 2018-02-10: qty 5

## 2018-02-10 MED ORDER — BACITRACIN-POLYMYXIN B 500-10000 UNIT/GM OP OINT
TOPICAL_OINTMENT | Freq: Three times a day (TID) | OPHTHALMIC | Status: DC
  Administered 2018-02-10 – 2018-02-11 (×3): via OPHTHALMIC
  Administered 2018-02-11: 1 via OPHTHALMIC
  Administered 2018-02-12: 09:00:00 via OPHTHALMIC
  Filled 2018-02-10: qty 3.5

## 2018-02-10 MED ORDER — CEFAZOLIN SODIUM-DEXTROSE 1-4 GM/50ML-% IV SOLN
1.0000 g | Freq: Three times a day (TID) | INTRAVENOUS | Status: AC
  Administered 2018-02-10 – 2018-02-11 (×3): 1 g via INTRAVENOUS
  Filled 2018-02-10 (×3): qty 50

## 2018-02-10 MED ORDER — FENTANYL CITRATE (PF) 100 MCG/2ML IJ SOLN: 25.0000 ug | INTRAMUSCULAR | Status: DC | PRN

## 2018-02-10 MED ORDER — DIAZEPAM 5 MG PO TABS
5.0000 mg | ORAL_TABLET | Freq: Four times a day (QID) | ORAL | Status: DC | PRN
  Administered 2018-02-11 – 2018-02-12 (×2): 5 mg via ORAL
  Filled 2018-02-10 (×2): qty 1

## 2018-02-10 MED ORDER — OXYMETAZOLINE HCL 0.05 % NA SOLN
NASAL | Status: DC | PRN
  Administered 2018-02-10: 1

## 2018-02-10 MED ORDER — OXYCODONE HCL ER 10 MG PO T12A
10.0000 mg | EXTENDED_RELEASE_TABLET | Freq: Two times a day (BID) | ORAL | Status: DC
  Administered 2018-02-10 – 2018-02-12 (×4): 10 mg via ORAL
  Filled 2018-02-10 (×4): qty 1

## 2018-02-10 MED ORDER — DEXAMETHASONE SODIUM PHOSPHATE 10 MG/ML IJ SOLN
INTRAMUSCULAR | Status: DC | PRN
  Administered 2018-02-10: 10 mg via INTRAVENOUS

## 2018-02-10 MED ORDER — LIDOCAINE-EPINEPHRINE 1 %-1:100000 IJ SOLN
INTRAMUSCULAR | Status: AC
  Filled 2018-02-10: qty 1

## 2018-02-10 MED ORDER — DEXAMETHASONE SODIUM PHOSPHATE 10 MG/ML IJ SOLN
INTRAMUSCULAR | Status: AC
  Filled 2018-02-10: qty 1

## 2018-02-10 MED ORDER — LISINOPRIL 10 MG PO TABS
10.0000 mg | ORAL_TABLET | Freq: Every day | ORAL | Status: DC
  Administered 2018-02-10 – 2018-02-12 (×3): 10 mg via ORAL
  Filled 2018-02-10 (×3): qty 1

## 2018-02-10 MED ORDER — LIDOCAINE 2% (20 MG/ML) 5 ML SYRINGE
INTRAMUSCULAR | Status: DC | PRN
  Administered 2018-02-10: 60 mg via INTRAVENOUS

## 2018-02-10 MED ORDER — SUGAMMADEX SODIUM 200 MG/2ML IV SOLN
INTRAVENOUS | Status: DC | PRN
  Administered 2018-02-10: 200 mg via INTRAVENOUS

## 2018-02-10 MED ORDER — MIDAZOLAM HCL 2 MG/2ML IJ SOLN
INTRAMUSCULAR | Status: DC | PRN
  Administered 2018-02-10: 2 mg via INTRAVENOUS

## 2018-02-10 MED ORDER — FENTANYL CITRATE (PF) 250 MCG/5ML IJ SOLN
INTRAMUSCULAR | Status: AC
  Filled 2018-02-10: qty 5

## 2018-02-10 MED ORDER — LIDOCAINE 2% (20 MG/ML) 5 ML SYRINGE
INTRAMUSCULAR | Status: AC
  Filled 2018-02-10: qty 5

## 2018-02-10 MED ORDER — OXYCODONE HCL 5 MG PO TABS
5.0000 mg | ORAL_TABLET | ORAL | Status: DC | PRN
  Administered 2018-02-10 – 2018-02-12 (×6): 10 mg via ORAL
  Filled 2018-02-10 (×5): qty 2

## 2018-02-10 MED ORDER — BACITRACIN ZINC 500 UNIT/GM EX OINT
TOPICAL_OINTMENT | CUTANEOUS | Status: AC
  Filled 2018-02-10: qty 28.35

## 2018-02-10 MED ORDER — CEFAZOLIN SODIUM 1 G IJ SOLR
INTRAMUSCULAR | Status: AC
  Filled 2018-02-10: qty 20

## 2018-02-10 MED ORDER — PROPOFOL 10 MG/ML IV BOLUS
INTRAVENOUS | Status: AC
  Filled 2018-02-10: qty 20

## 2018-02-10 MED ORDER — LACTATED RINGERS IV SOLN
INTRAVENOUS | Status: DC
  Administered 2018-02-10: 12:00:00 via INTRAVENOUS

## 2018-02-10 MED ORDER — HYDROMORPHONE HCL 1 MG/ML IJ SOLN
INTRAMUSCULAR | Status: AC
  Filled 2018-02-10: qty 1

## 2018-02-10 MED ORDER — TERAZOSIN HCL 1 MG PO CAPS
1.0000 mg | ORAL_CAPSULE | Freq: Every day | ORAL | Status: DC
  Administered 2018-02-10 – 2018-02-11 (×2): 1 mg via ORAL
  Filled 2018-02-10 (×2): qty 1

## 2018-02-10 MED ORDER — CEFAZOLIN SODIUM-DEXTROSE 2-3 GM-%(50ML) IV SOLR
INTRAVENOUS | Status: DC | PRN
  Administered 2018-02-10: 2 g via INTRAVENOUS

## 2018-02-10 MED ORDER — ROCURONIUM BROMIDE 10 MG/ML (PF) SYRINGE
PREFILLED_SYRINGE | INTRAVENOUS | Status: DC | PRN
  Administered 2018-02-10: 25 mg via INTRAVENOUS
  Administered 2018-02-10: 50 mg via INTRAVENOUS
  Administered 2018-02-10: 25 mg via INTRAVENOUS

## 2018-02-10 MED ORDER — 0.9 % SODIUM CHLORIDE (POUR BTL) OPTIME
TOPICAL | Status: DC | PRN
  Administered 2018-02-10: 1000 mL

## 2018-02-10 MED ORDER — ONDANSETRON HCL 4 MG/2ML IJ SOLN
INTRAMUSCULAR | Status: DC | PRN
  Administered 2018-02-10: 4 mg via INTRAVENOUS

## 2018-02-10 MED ORDER — LIDOCAINE-EPINEPHRINE 1 %-1:100000 IJ SOLN
INTRAMUSCULAR | Status: DC | PRN
  Administered 2018-02-10: 6 mL

## 2018-02-10 MED ORDER — FENTANYL CITRATE (PF) 250 MCG/5ML IJ SOLN
INTRAMUSCULAR | Status: DC | PRN
  Administered 2018-02-10: 50 ug via INTRAVENOUS
  Administered 2018-02-10: 100 ug via INTRAVENOUS
  Administered 2018-02-10: 50 ug via INTRAVENOUS
  Administered 2018-02-10: 25 ug via INTRAVENOUS
  Administered 2018-02-10 (×2): 50 ug via INTRAVENOUS
  Administered 2018-02-10: 25 ug via INTRAVENOUS
  Administered 2018-02-10: 50 ug via INTRAVENOUS

## 2018-02-10 MED ORDER — DICYCLOMINE HCL 10 MG PO CAPS: 10.0000 mg | ORAL_CAPSULE | Freq: Three times a day (TID) | ORAL | Status: DC | PRN

## 2018-02-10 MED ORDER — ONDANSETRON HCL 4 MG PO TABS: 4.0000 mg | ORAL_TABLET | ORAL | Status: DC | PRN

## 2018-02-10 MED ORDER — BACITRACIN ZINC 500 UNIT/GM EX OINT
1.0000 "application " | TOPICAL_OINTMENT | Freq: Three times a day (TID) | CUTANEOUS | Status: DC
  Administered 2018-02-10 – 2018-02-12 (×5): 1 via TOPICAL
  Filled 2018-02-10: qty 28.35

## 2018-02-10 MED ORDER — EPHEDRINE SULFATE-NACL 50-0.9 MG/10ML-% IV SOSY
PREFILLED_SYRINGE | INTRAVENOUS | Status: DC | PRN
  Administered 2018-02-10: 10 mg via INTRAVENOUS

## 2018-02-10 MED ORDER — FENTANYL CITRATE (PF) 100 MCG/2ML IJ SOLN
INTRAMUSCULAR | Status: AC
  Filled 2018-02-10: qty 2

## 2018-02-10 MED ORDER — HYDRALAZINE HCL 20 MG/ML IJ SOLN
INTRAMUSCULAR | Status: AC
  Administered 2018-02-10: 5 mg via INTRAVENOUS
  Filled 2018-02-10: qty 1

## 2018-02-10 MED ORDER — OXYMETAZOLINE HCL 0.05 % NA SOLN
NASAL | Status: AC
  Filled 2018-02-10: qty 15

## 2018-02-10 MED ORDER — GABAPENTIN 600 MG PO TABS
600.0000 mg | ORAL_TABLET | Freq: Three times a day (TID) | ORAL | Status: DC
  Administered 2018-02-10 – 2018-02-12 (×5): 600 mg via ORAL
  Filled 2018-02-10 (×5): qty 1

## 2018-02-10 MED ORDER — KCL IN DEXTROSE-NACL 20-5-0.45 MEQ/L-%-% IV SOLN
INTRAVENOUS | Status: DC
  Administered 2018-02-10 – 2018-02-11 (×2): via INTRAVENOUS
  Filled 2018-02-10 (×2): qty 1000

## 2018-02-10 MED ORDER — HYDROMORPHONE HCL 1 MG/ML IJ SOLN
0.2500 mg | INTRAMUSCULAR | Status: DC | PRN
  Administered 2018-02-10: 0.5 mg via INTRAVENOUS

## 2018-02-10 MED ORDER — ONDANSETRON HCL 4 MG/2ML IJ SOLN: 4.0000 mg | INTRAMUSCULAR | Status: DC | PRN

## 2018-02-10 MED ORDER — BACITRACIN-POLYMYXIN B 500-10000 UNIT/GM OP OINT
TOPICAL_OINTMENT | OPHTHALMIC | Status: DC | PRN
  Administered 2018-02-10: 1 via OPHTHALMIC

## 2018-02-10 MED ORDER — ASPIRIN EC 81 MG PO TBEC
81.0000 mg | DELAYED_RELEASE_TABLET | Freq: Every day | ORAL | Status: DC
  Administered 2018-02-10 – 2018-02-12 (×3): 81 mg via ORAL
  Filled 2018-02-10 (×3): qty 1

## 2018-02-10 MED ORDER — FLUOXETINE HCL 20 MG PO TABS
20.0000 mg | ORAL_TABLET | Freq: Every day | ORAL | Status: DC
  Administered 2018-02-10: 20 mg via ORAL
  Filled 2018-02-10 (×4): qty 1

## 2018-02-10 MED ORDER — OXYCODONE HCL 5 MG PO TABS
ORAL_TABLET | ORAL | Status: AC
  Administered 2018-02-10: 10 mg via ORAL
  Filled 2018-02-10: qty 2

## 2018-02-10 MED ORDER — BACITRACIN-POLYMYXIN B 500-10000 UNIT/GM OP OINT
TOPICAL_OINTMENT | OPHTHALMIC | Status: AC
  Filled 2018-02-10: qty 3.5

## 2018-02-10 MED ORDER — LACTATED RINGERS IV SOLN
INTRAVENOUS | Status: DC | PRN
  Administered 2018-02-10 (×2): via INTRAVENOUS

## 2018-02-10 MED ORDER — HYDRALAZINE HCL 20 MG/ML IJ SOLN
5.0000 mg | INTRAMUSCULAR | Status: AC | PRN
  Administered 2018-02-10 (×2): 5 mg via INTRAVENOUS

## 2018-02-10 MED ORDER — GELATIN ADSORBABLE OP FILM
ORAL_FILM | OPHTHALMIC | Status: AC
  Filled 2018-02-10: qty 1

## 2018-02-10 MED ORDER — MIDAZOLAM HCL 2 MG/2ML IJ SOLN
INTRAMUSCULAR | Status: AC
  Filled 2018-02-10: qty 2

## 2018-02-10 MED ORDER — MORPHINE SULFATE (PF) 2 MG/ML IV SOLN: 2.0000 mg | INTRAVENOUS | Status: DC | PRN

## 2018-02-10 MED ORDER — PROPOFOL 10 MG/ML IV BOLUS
INTRAVENOUS | Status: DC | PRN
  Administered 2018-02-10: 150 mg via INTRAVENOUS

## 2018-02-10 MED ORDER — ONDANSETRON HCL 4 MG/2ML IJ SOLN
INTRAMUSCULAR | Status: AC
  Filled 2018-02-10: qty 2

## 2018-02-10 SURGICAL SUPPLY — 67 items
APPLICATOR DR MATTHEWS STRL (MISCELLANEOUS) ×4 IMPLANT
BENZOIN TINCTURE PRP APPL 2/3 (GAUZE/BANDAGES/DRESSINGS) ×4 IMPLANT
BLADE CLIPPER SURG (BLADE) IMPLANT
BLADE SURG 15 STRL LF DISP TIS (BLADE) ×2 IMPLANT
BLADE SURG 15 STRL SS (BLADE) ×2
CANISTER SUCT 3000ML PPV (MISCELLANEOUS) ×4 IMPLANT
CLEANER TIP ELECTROSURG 2X2 (MISCELLANEOUS) ×4 IMPLANT
CLOSURE STERI-STRIP 1/2X4 (GAUZE/BANDAGES/DRESSINGS) ×1
CLOSURE WOUND 1/2 X4 (GAUZE/BANDAGES/DRESSINGS) ×1
CLSR STERI-STRIP ANTIMIC 1/2X4 (GAUZE/BANDAGES/DRESSINGS) ×3 IMPLANT
CONFORMERS SILICONE 5649 (OPHTHALMIC RELATED) IMPLANT
COVER BACK TABLE 60X90IN (DRAPES) IMPLANT
COVER MAYO STAND STRL (DRAPES) ×4 IMPLANT
COVER SURGICAL LIGHT HANDLE (MISCELLANEOUS) ×4 IMPLANT
COVER WAND RF STERILE (DRAPES) IMPLANT
CRADLE DONUT ADULT HEAD (MISCELLANEOUS) ×4 IMPLANT
DECANTER SPIKE VIAL GLASS SM (MISCELLANEOUS) ×4 IMPLANT
DRAPE HALF SHEET 40X57 (DRAPES) IMPLANT
DRILL 1.2MM TWIST 1.0 X 46MM (BIT) ×4 IMPLANT
ELECT COATED BLADE 2.86 ST (ELECTRODE) ×4 IMPLANT
ELECT NEEDLE BLADE 2-5/6 (NEEDLE) ×4 IMPLANT
ELECT REM PT RETURN 9FT ADLT (ELECTROSURGICAL) ×4
ELECTRODE REM PT RTRN 9FT ADLT (ELECTROSURGICAL) ×2 IMPLANT
GAUZE 4X4 16PLY RFD (DISPOSABLE) ×4 IMPLANT
GAUZE SPONGE 2X2 8PLY STRL LF (GAUZE/BANDAGES/DRESSINGS) IMPLANT
GLOVE BIO SURGEON STRL SZ7.5 (GLOVE) ×8 IMPLANT
GOWN STRL REUS W/ TWL LRG LVL3 (GOWN DISPOSABLE) ×4 IMPLANT
GOWN STRL REUS W/TWL LRG LVL3 (GOWN DISPOSABLE) ×4
KIT BASIN OR (CUSTOM PROCEDURE TRAY) ×4 IMPLANT
KIT TURNOVER KIT B (KITS) ×4 IMPLANT
NEEDLE HYPO 25GX1X1/2 BEV (NEEDLE) ×4 IMPLANT
NEEDLE PRECISIONGLIDE 27X1.5 (NEEDLE) ×4 IMPLANT
NS IRRIG 1000ML POUR BTL (IV SOLUTION) ×4 IMPLANT
PAD ARMBOARD 7.5X6 YLW CONV (MISCELLANEOUS) ×8 IMPLANT
PATTIES SURGICAL .5 X3 (DISPOSABLE) ×4 IMPLANT
PENCIL BUTTON HOLSTER BLD 10FT (ELECTRODE) ×4 IMPLANT
PLATE ORBITAL RIM MID FACE (Plate) ×4 IMPLANT
PLATE UPPER FACE 4H ORBITAL (Plate) ×8 IMPLANT
SCISSORS WIRE ANG 4 3/4 DISP (INSTRUMENTS) IMPLANT
SCREW UPPERFACE 1.2X4M SLF TAP (Screw) ×28 IMPLANT
SCREW UPPERFACE 1.4X3MM (Screw) ×20 IMPLANT
SPLINT NASAL THERMO PLAST (MISCELLANEOUS) ×4 IMPLANT
SPONGE GAUZE 2X2 STER 10/PKG (GAUZE/BANDAGES/DRESSINGS)
STRIP CLOSURE SKIN 1/2X4 (GAUZE/BANDAGES/DRESSINGS) ×3 IMPLANT
SUT ETHILON 4 0 CL P 3 (SUTURE) IMPLANT
SUT ETHILON 5 0 PS 2 18 (SUTURE) IMPLANT
SUT ETHILON 6 0 P 1 (SUTURE) ×8 IMPLANT
SUT MON AB 5-0 PS2 18 (SUTURE) ×4 IMPLANT
SUT PLAIN 6 0 TG1408 (SUTURE) ×4 IMPLANT
SUT PROLENE 6 0 PC 1 (SUTURE) IMPLANT
SUT SILK 3-0 (SUTURE) ×2
SUT SILK 3-0 RB1 30XBRD (SUTURE) ×2
SUT SILK 6 0 G 6 (SUTURE) ×4 IMPLANT
SUT STEEL 0 (SUTURE)
SUT STEEL 0 18XMFL TIE 17 (SUTURE) IMPLANT
SUT STEEL 2 (SUTURE) IMPLANT
SUT VIC AB 4-0 PS2 18 (SUTURE) ×4 IMPLANT
SUT VICRYL 4-0 PS2 18IN ABS (SUTURE) IMPLANT
SUTURE SILK 3-0 RB1 30XBRD (SUTURE) ×2 IMPLANT
SYR CONTROL 10ML LL (SYRINGE) ×4 IMPLANT
TOWEL OR 17X24 6PK STRL BLUE (TOWEL DISPOSABLE) ×4 IMPLANT
TRAY ENT MC OR (CUSTOM PROCEDURE TRAY) ×4 IMPLANT
TRAY FOLEY MTR SLVR 14FR STAT (SET/KITS/TRAYS/PACK) IMPLANT
TUBE CONNECTING 12'X1/4 (SUCTIONS) ×1
TUBE CONNECTING 12X1/4 (SUCTIONS) ×3 IMPLANT
WATER STERILE IRR 1000ML POUR (IV SOLUTION) ×4 IMPLANT
YANKAUER SUCT BULB TIP NO VENT (SUCTIONS) ×4 IMPLANT
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
## 2018-02-10 NOTE — Brief Op Note (Signed)
02/10/2018  4:20 PM  PATIENT:  Adam Cameron  56 y.o. male  PRE-OPERATIVE DIAGNOSIS:  orbital fracture, closed nasal reduction  POST-OPERATIVE DIAGNOSIS:  orbital fracture, closed nasal reduction  PROCEDURE:  Procedure(s): OPEN REDUCTION INTERNAL FIXATION (ORIF) ORBITAL FRACTURE (Right) CLOSED REDUCTION NASAL FRACTURE (N/A)  SURGEON:  Surgeon(s) and Role:    Christia Reading, MD - Primary  PHYSICIAN ASSISTANT:   ASSISTANTS: none   ANESTHESIA:   general  EBL:  20 mL   BLOOD ADMINISTERED:none  DRAINS: none   LOCAL MEDICATIONS USED:  LIDOCAINE   SPECIMEN:  No Specimen  DISPOSITION OF SPECIMEN:  N/A  COUNTS:  YES  TOURNIQUET:  * No tourniquets in log *  DICTATION: .Other Dictation: Dictation Number [id number]  PLAN OF CARE: Admit for overnight observation  PATIENT DISPOSITION:  PACU - hemodynamically stable.   Delay start of Pharmacological VTE agent (>24hrs) due to surgical blood loss or risk of bleeding: no

## 2018-02-10 NOTE — Transfer of Care (Signed)
Immediate Anesthesia Transfer of Care Note  Patient: Adam Cameron  Procedure(s) Performed: OPEN REDUCTION INTERNAL FIXATION (ORIF) ORBITAL FRACTURE (Right Eye) CLOSED REDUCTION NASAL FRACTURE (N/A Nose)  Patient Location: PACU  Anesthesia Type:General  Level of Consciousness: awake, alert  and oriented  Airway & Oxygen Therapy: Patient Spontanous Breathing and Patient connected to face mask oxygen  Post-op Assessment: Report given to RN and Post -op Vital signs reviewed and stable  Post vital signs: Reviewed and stable  Last Vitals:  Vitals Value Taken Time  BP    Temp    Pulse    Resp    SpO2      Last Pain:  Vitals:   02/10/18 1111  TempSrc: Oral         Complications: No apparent anesthesia complications

## 2018-02-10 NOTE — Progress Notes (Signed)
Admitted to 6N11 post nasal and orbital surgery by Dr. Jenne Pane. Alert and oriented, not in any distress. Oriented to unit and staff. Ice to operative side .Will endorse appropriately.

## 2018-02-10 NOTE — Anesthesia Procedure Notes (Signed)
Procedure Name: Intubation Date/Time: 02/10/2018 2:14 PM Performed by: Teressa Lower., CRNA Pre-anesthesia Checklist: Patient identified, Emergency Drugs available, Suction available and Patient being monitored Patient Re-evaluated:Patient Re-evaluated prior to induction Oxygen Delivery Method: Circle system utilized Preoxygenation: Pre-oxygenation with 100% oxygen Induction Type: IV induction Ventilation: Mask ventilation without difficulty Laryngoscope Size: Mac and 4 Grade View: Grade I Tube type: Oral Tube size: 7.5 mm Number of attempts: 1 Airway Equipment and Method: Stylet and Oral airway Placement Confirmation: ETT inserted through vocal cords under direct vision,  positive ETCO2 and breath sounds checked- equal and bilateral Secured at: 22 cm Tube secured with: Tape Dental Injury: Teeth and Oropharynx as per pre-operative assessment

## 2018-02-11 ENCOUNTER — Encounter (HOSPITAL_COMMUNITY): Payer: Self-pay | Admitting: General Practice

## 2018-02-11 ENCOUNTER — Other Ambulatory Visit: Payer: Self-pay

## 2018-02-11 DIAGNOSIS — S0240EA Zygomatic fracture, right side, initial encounter for closed fracture: Secondary | ICD-10-CM | POA: Diagnosis not present

## 2018-02-11 MED ORDER — FLUOXETINE HCL 20 MG PO CAPS
20.0000 mg | ORAL_CAPSULE | Freq: Every day | ORAL | Status: DC
  Administered 2018-02-11 – 2018-02-12 (×2): 20 mg via ORAL
  Filled 2018-02-11 (×2): qty 1

## 2018-02-11 NOTE — Progress Notes (Signed)
   Subjective:    Patient ID: Adam Cameron, male    DOB: 1960-10-29, 57 y.o.   MRN: [id number]  HPI Doing well this morning.  Some facial pain.  Review of Systems     Objective:   Physical Exam AF VSS Alert, NAD Some right facial edema.  Good facial contour.  Right lateral brow and lateral canthus incisions clean and intact.    Assessment & Plan:  S/p ORIF right orbitozygomatic complex fracture and closed nasal reduction  Doing well.  He does not feel ready for discharge.  Will advance diet.  Likely discharge tomorrow.

## 2018-02-11 NOTE — Anesthesia Postprocedure Evaluation (Signed)
Anesthesia Post Note  Patient: Adam Cameron  Procedure(s) Performed: OPEN REDUCTION INTERNAL FIXATION (ORIF) ORBITAL FRACTURE (Right Eye) CLOSED REDUCTION NASAL FRACTURE (N/A Nose)     Patient location during evaluation: PACU Anesthesia Type: General Level of consciousness: awake and patient cooperative Pain management: pain level controlled Vital Signs Assessment: post-procedure vital signs reviewed and stable Respiratory status: spontaneous breathing, nonlabored ventilation, respiratory function stable and patient connected to nasal cannula oxygen Cardiovascular status: blood pressure returned to baseline and stable Postop Assessment: no apparent nausea or vomiting Anesthetic complications: no    Last Vitals:  Vitals:   02/10/18 2037 02/11/18 0429  BP: (!) 164/92 (!) 144/85  Pulse: 72 68  Resp:  16  Temp: 37.4 C 37.4 C  SpO2: 99% 99%    Last Pain:  Vitals:   02/11/18 0619  TempSrc:   PainSc: 2                  Matheson Vandehei

## 2018-02-11 NOTE — Care Management Obs Status (Signed)
MEDICARE OBSERVATION STATUS NOTIFICATION   Patient Details  Name: Embry Huss MRN: [id number] Date of Birth: 1960-09-01   Medicare Observation Status Notification Given:       Kingsley Plan, RN 02/11/2018, 11:04 AM

## 2018-02-12 DIAGNOSIS — S0240EA Zygomatic fracture, right side, initial encounter for closed fracture: Secondary | ICD-10-CM | POA: Diagnosis not present

## 2018-02-12 MED ORDER — BACITRACIN-POLYMYXIN B 500-10000 UNIT/GM OP OINT: TOPICAL_OINTMENT | Freq: Three times a day (TID) | OPHTHALMIC | 0 refills | Status: AC

## 2018-02-12 MED ORDER — OXYCODONE HCL 10 MG PO TABS: 10.0000 mg | ORAL_TABLET | Freq: Three times a day (TID) | ORAL | 0 refills | Status: AC | PRN

## 2018-02-12 NOTE — Discharge Summary (Signed)
Physician Discharge Summary  Patient ID: Adam Cameron MRN: [id number] DOB/AGE: May 01, 1961 57 y.o.  Admit date: 02/10/2018 Discharge date: 02/12/2018  Admission Diagnoses: Right maxillary and nasal fractures  Discharge Diagnoses:  Active Problems:   Maxillary fracture (HCC) Nasal fracture  Discharged Condition: good  Hospital Course: 57 year old male presented to the operating room for repair of right maxillary and nasal fractures.  See operative note.  He was observed overnight and did well but did not feel ready for discharge on POD 1.  By POD 2, he continued to do well aside from right facial edema and is stable for discharge.  Consults: None  Significant Diagnostic Studies: None  Treatments: surgery: ORIF right orbitozygomatic complex fracture and closed nasal reduction  Discharge Exam: Blood pressure 131/81, pulse 64, temperature 98.3 F (36.8 C), temperature source Oral, resp. rate 18, height 5' 7.5" (1.715 m), weight 77.1 kg, SpO2 99 %. General appearance: alert, cooperative and no distress Head: Right facial and right eyelids edema.  Disposition: Discharge disposition: 01-Home or Self Care       Discharge Instructions    Diet - low sodium heart healthy   Complete by:  As directed    Discharge instructions   Complete by:  As directed    Apply ophthalmic ointment in right eye and to right brow incision three times daily.  Keep head elevated.  Can apply ice packs to right face to help swelling.  Soft diet only.  Rinse mouth with salt water after meals and at bedtime.  Do not blow the nose.  Keep nasal splint dry.  If comes loose, tape back in place.   Increase activity slowly   Complete by:  As directed      Allergies as of 02/12/2018      Reactions   Dicloxacillin Rash   Has patient had a PCN reaction causing immediate rash, facial/tongue/throat swelling, SOB or lightheadedness with hypotension: No Has patient had a PCN reaction causing severe rash involving  mucus membranes or skin necrosis: No Has patient had a PCN reaction that required hospitalization No Has patient had a PCN reaction occurring within the last 10 years: No If all of the above answers are "NO", then may proceed with Cephalosporin use.   Tylenol [acetaminophen] Rash      Medication List    TAKE these medications   aspirin EC 81 MG tablet Take 81 mg by mouth daily.   bacitracin-polymyxin b ophthalmic ointment Commonly known as:  POLYSPORIN Place into the right eye 3 (three) times daily.   cyclobenzaprine 10 MG tablet Commonly known as:  FLEXERIL Take 1 tablet (10 mg total) by mouth 2 (two) times daily as needed for muscle spasms.   diazepam 5 MG tablet Commonly known as:  VALIUM Take 5 mg by mouth every 6 (six) hours as needed for anxiety.   dicyclomine 10 MG capsule Commonly known as:  BENTYL Take 10 mg by mouth 3 (three) times daily as needed for spasms.   FLUoxetine 20 MG tablet Commonly known as:  PROZAC Take 20 mg by mouth daily.   fluticasone 50 MCG/ACT nasal spray Commonly known as:  FLONASE Place 2 sprays into both nostrils daily.   gabapentin 600 MG tablet Commonly known as:  NEURONTIN Take 600 mg by mouth 3 (three) times daily.   hydrOXYzine 10 MG tablet Commonly known as:  ATARAX/VISTARIL Take 1 tablet (10 mg total) by mouth every 6 (six) hours as needed for itching.   lisinopril 10 MG  tablet Commonly known as:  PRINIVIL,ZESTRIL Take 10 mg by mouth daily.   oxyCODONE 10 mg 12 hr tablet Commonly known as:  OXYCONTIN Take 10 mg by mouth every 12 (twelve) hours. What changed:  Another medication with the same name was added. Make sure you understand how and when to take each.   oxyCODONE 5 MG immediate release tablet Commonly known as:  Oxy IR/ROXICODONE Take 1 tablet (5 mg total) by mouth every 4 (four) hours as needed for severe pain. What changed:  Another medication with the same name was added. Make sure you understand how and when to  take each.   Oxycodone HCl 10 MG Tabs Take 1 tablet (10 mg total) by mouth every 8 (eight) hours as needed. What changed:  You were already taking a medication with the same name, and this prescription was added. Make sure you understand how and when to take each.   predniSONE 10 MG tablet Commonly known as:  DELTASONE Take 2 tablets (20 mg total) by mouth 2 (two) times daily with a meal.   PRESCRIPTION MEDICATION Take 1 capsule by mouth at bedtime. Prostate medication   terazosin 1 MG capsule Commonly known as:  HYTRIN Take 1 mg by mouth at bedtime.   triamcinolone cream 0.1 % Commonly known as:  KENALOG Apply 1 application topically 2 (two) times daily.      Follow-up Information    Christia Reading, MD. Schedule an appointment as soon as possible for a visit on 02/17/2018.   Specialty:  Otolaryngology Contact information: [address] [place] [address] [phone]           Signed: Christia Reading 02/12/2018, 9:32 AM
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
## 2019-03-17 IMAGING — CR DG CHEST 2V
2 series · 2 of 2 positions shown · non-contrast
Comparison: 07/25/2016 chest radiograph.

CLINICAL DATA: 56 y/o  M; shortness of breath for 2 weeks.

EXAM:
CHEST - 2 VIEW

[w chest pa]
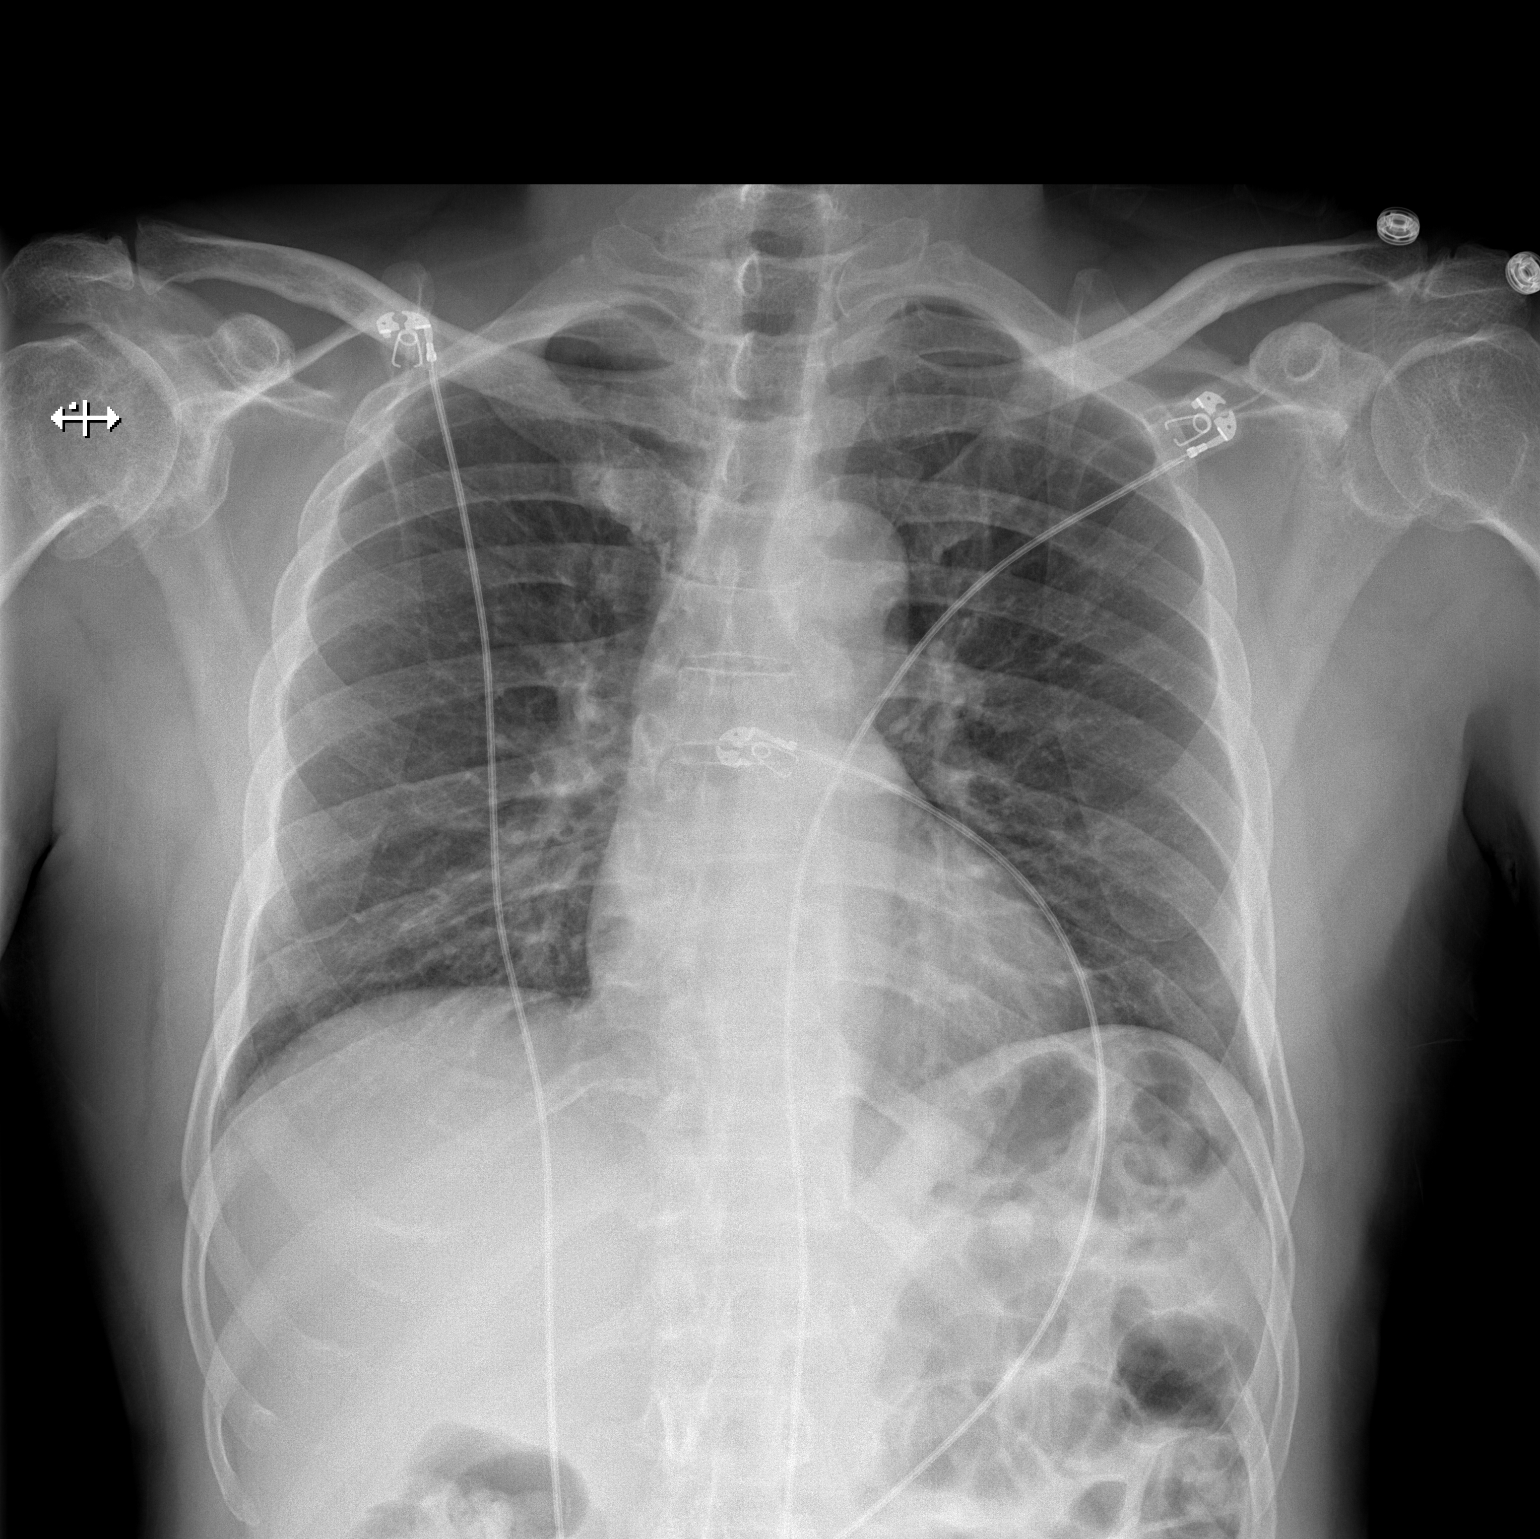

[w chest lat]
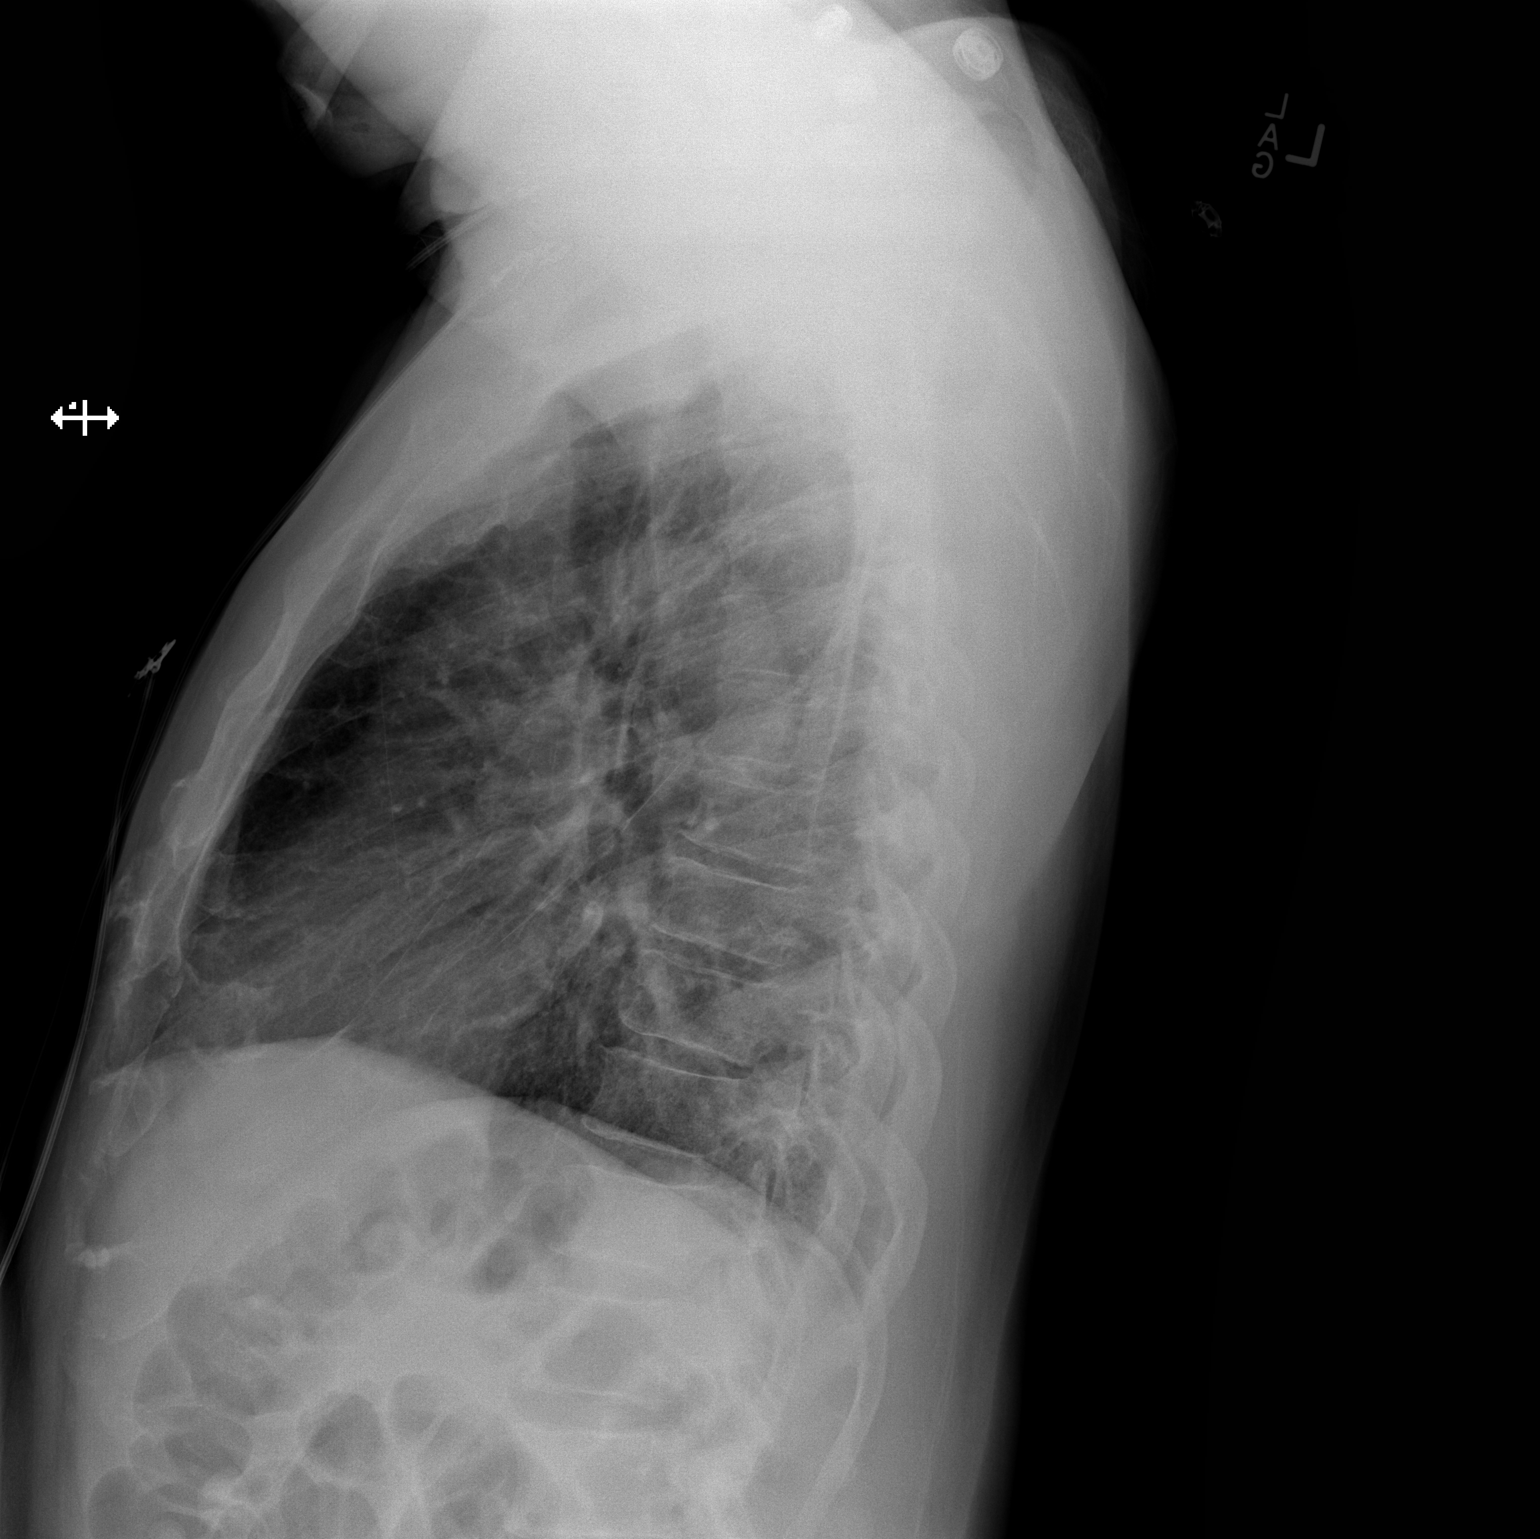

[2 of 2 positions shown; findings below may reference images not displayed]

FINDINGS: Stable heart size and mediastinal contours are within normal limits.
Both lungs are clear. The visualized skeletal structures are
unremarkable.
IMPRESSION: No active cardiopulmonary disease.

By: Don Lolito Onacram M.D.

## 2020-03-22 ENCOUNTER — Emergency Department (HOSPITAL_COMMUNITY)
Admission: EM | Admit: 2020-03-22 | Discharge: 2020-03-22 | Disposition: A | Discharge status: Home / Self Care | Payer: Medicare Other | Source: Home / Self Care | Attending: Emergency Medicine | Admitting: Emergency Medicine

## 2020-03-22 ENCOUNTER — Encounter (HOSPITAL_COMMUNITY): Payer: Self-pay

## 2020-03-22 ENCOUNTER — Emergency Department (HOSPITAL_COMMUNITY)
Admission: EM | Admit: 2020-03-22 | Discharge: 2020-03-22 | Disposition: A | Discharge status: Home / Self Care | Payer: Medicare Other | Attending: Emergency Medicine | Admitting: Emergency Medicine

## 2020-03-22 ENCOUNTER — Emergency Department (HOSPITAL_COMMUNITY): Payer: Medicare Other

## 2020-03-22 ENCOUNTER — Other Ambulatory Visit: Payer: Self-pay

## 2020-03-22 DIAGNOSIS — Z79899 Other long term (current) drug therapy: Secondary | ICD-10-CM | POA: Insufficient documentation

## 2020-03-22 DIAGNOSIS — W312XXD Contact with powered woodworking and forming machines, subsequent encounter: Secondary | ICD-10-CM | POA: Insufficient documentation

## 2020-03-22 DIAGNOSIS — Z7982 Long term (current) use of aspirin: Secondary | ICD-10-CM | POA: Insufficient documentation

## 2020-03-22 DIAGNOSIS — I2510 Atherosclerotic heart disease of native coronary artery without angina pectoris: Secondary | ICD-10-CM | POA: Insufficient documentation

## 2020-03-22 DIAGNOSIS — I10 Essential (primary) hypertension: Secondary | ICD-10-CM | POA: Insufficient documentation

## 2020-03-22 DIAGNOSIS — R030 Elevated blood-pressure reading, without diagnosis of hypertension: Secondary | ICD-10-CM

## 2020-03-22 DIAGNOSIS — S61210A Laceration without foreign body of right index finger without damage to nail, initial encounter: Secondary | ICD-10-CM | POA: Diagnosis present

## 2020-03-22 DIAGNOSIS — W270XXA Contact with workbench tool, initial encounter: Secondary | ICD-10-CM | POA: Insufficient documentation

## 2020-03-22 DIAGNOSIS — J449 Chronic obstructive pulmonary disease, unspecified: Secondary | ICD-10-CM | POA: Insufficient documentation

## 2020-03-22 DIAGNOSIS — F17210 Nicotine dependence, cigarettes, uncomplicated: Secondary | ICD-10-CM | POA: Insufficient documentation

## 2020-03-22 DIAGNOSIS — S61209D Unspecified open wound of unspecified finger without damage to nail, subsequent encounter: Secondary | ICD-10-CM

## 2020-03-22 DIAGNOSIS — S61310A Laceration without foreign body of right index finger with damage to nail, initial encounter: Secondary | ICD-10-CM

## 2020-03-22 DIAGNOSIS — Z794 Long term (current) use of insulin: Secondary | ICD-10-CM | POA: Insufficient documentation

## 2020-03-22 DIAGNOSIS — S61300D Unspecified open wound of right index finger with damage to nail, subsequent encounter: Secondary | ICD-10-CM | POA: Insufficient documentation

## 2020-03-22 DIAGNOSIS — Z23 Encounter for immunization: Secondary | ICD-10-CM | POA: Insufficient documentation

## 2020-03-22 MED ORDER — DOXYCYCLINE HYCLATE 100 MG PO TABS
100.0000 mg | ORAL_TABLET | Freq: Once | ORAL | Status: AC
  Administered 2020-03-22: 100 mg via ORAL
  Filled 2020-03-22: qty 1

## 2020-03-22 MED ORDER — OXYCODONE HCL 5 MG PO TABS
5.0000 mg | ORAL_TABLET | Freq: Once | ORAL | Status: AC
  Administered 2020-03-22: 5 mg via ORAL
  Filled 2020-03-22: qty 1

## 2020-03-22 MED ORDER — DOXYCYCLINE HYCLATE 100 MG PO CAPS: 100.0000 mg | ORAL_CAPSULE | Freq: Two times a day (BID) | ORAL | 0 refills | Status: AC

## 2020-03-22 MED ORDER — DOXYCYCLINE HYCLATE 100 MG PO CAPS: 100.0000 mg | ORAL_CAPSULE | Freq: Two times a day (BID) | ORAL | 0 refills | Status: DC

## 2020-03-22 MED ORDER — TETANUS-DIPHTH-ACELL PERTUSSIS 5-2.5-18.5 LF-MCG/0.5 IM SUSY
0.5000 mL | PREFILLED_SYRINGE | Freq: Once | INTRAMUSCULAR | Status: AC
  Administered 2020-03-22: 0.5 mL via INTRAMUSCULAR
  Filled 2020-03-22: qty 0.5

## 2020-03-22 MED ORDER — OXYCODONE HCL 5 MG PO CAPS: 5.0000 mg | ORAL_CAPSULE | Freq: Four times a day (QID) | ORAL | 0 refills | Status: AC | PRN

## 2020-03-22 NOTE — ED Triage Notes (Signed)
Patient reports laceration to index finger on right hand while using a table saw.    A/Ox4 Ambulatory in triage 10/10 pain
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
## 2020-03-22 NOTE — ED Triage Notes (Signed)
Pt arrives to ED w/ lac to R index finger while using table saw. Pt reports 8/10 pain, bleeding controlled.

## 2020-03-22 NOTE — Discharge Instructions (Addendum)
At this time there does not appear to be the presence of an emergent medical condition, however there is always the potential for conditions to change. Please read and follow the below instructions.  Please return to the Emergency Department immediately for any new or worsening symptoms. Please be sure to follow up with your Primary Care Provider within one week regarding your visit today; please call their office to schedule an appointment even if you are feeling better for a follow-up visit. Please take your antibiotic Doxycycline as prescribed until complete to help with your symptoms.  Please drink enough water to avoid dehydration and get plenty of rest. Please follow-up with the hand specialist Dr. Arita Miss under discharge paperwork for further care. Your blood pressure was elevated in the ER today, please have it rechecked by your primary care provider at your follow-up appointment and discuss medication management if indicated at that time.  Go to the nearest Emergency Department immediately if: You have fever or chills Your pain suddenly increases and is severe. You develop severe swelling around the wound. The wound is on your hand or foot and you cannot properly move a finger or toe. The wound is on your hand or foot, and you notice that your fingers or toes look pale or bluish. You have a red streak going away from your wound. Get a very bad headache. Start to feel mixed up (confused). Feel weak or numb. Feel faint. Have very bad pain in your: Chest. Belly (abdomen). Throw up more than once. Have trouble breathing. You have any new/concerning or worsening of symptoms   Please read the additional information packets attached to your discharge summary.  Do not take your medicine if  develop an itchy rash, swelling in your mouth or lips, or difficulty breathing; call 911 and seek immediate emergency medical attention if this occurs.  You may review your lab tests and imaging  results in their entirety on your MyChart account.  Please discuss all results of fully with your primary care provider and other specialist at your follow-up visit.  Note: Portions of this text may have been transcribed using voice recognition software. Every effort was made to ensure accuracy; however, inadvertent computerized transcription errors may still be present.

## 2020-03-22 NOTE — Discharge Instructions (Addendum)
Please take antibiotics as directed and follow with hand specialist. Apply ice for 20 minutes at a time to help with pain, avoid getting your bandage wet. Elevate your hand as much as possible to help with pain and swelling.
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
## 2020-11-09 DEATH — deceased
